# Patient Record
Sex: Female | Born: 1945 | Race: White | Hispanic: No | Marital: Married | Smoking: Never smoker
Health system: Southern US, Community
[De-identification: ages and names within clinical notes are randomized; demographics above are authoritative.]

## PROBLEM LIST (undated history)

## (undated) DIAGNOSIS — G4730 Sleep apnea, unspecified: Secondary | ICD-10-CM

## (undated) DIAGNOSIS — R079 Chest pain, unspecified: Secondary | ICD-10-CM

## (undated) DIAGNOSIS — E785 Hyperlipidemia, unspecified: Secondary | ICD-10-CM

## (undated) DIAGNOSIS — E119 Type 2 diabetes mellitus without complications: Secondary | ICD-10-CM

## (undated) DIAGNOSIS — C9510 Chronic leukemia of unspecified cell type not having achieved remission: Secondary | ICD-10-CM

## (undated) DIAGNOSIS — I10 Essential (primary) hypertension: Secondary | ICD-10-CM

## (undated) DIAGNOSIS — C801 Malignant (primary) neoplasm, unspecified: Secondary | ICD-10-CM

## (undated) HISTORY — DX: Essential (primary) hypertension: I10

## (undated) HISTORY — DX: Chronic leukemia of unspecified cell type not having achieved remission: C95.10

## (undated) HISTORY — DX: Malignant (primary) neoplasm, unspecified: C80.1

## (undated) HISTORY — DX: Sleep apnea, unspecified: G47.30

## (undated) HISTORY — DX: Hyperlipidemia, unspecified: E78.5

## (undated) HISTORY — DX: Type 2 diabetes mellitus without complications: E11.9

## (undated) HISTORY — PX: ABDOMINAL HYSTERECTOMY: SHX81

## (undated) HISTORY — DX: Chest pain, unspecified: R07.9

---
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
## 2004-05-13 HISTORY — PX: ANKLE SURGERY: SHX546

## 2008-08-11 DIAGNOSIS — R079 Chest pain, unspecified: Secondary | ICD-10-CM

## 2008-08-11 HISTORY — DX: Chest pain, unspecified: R07.9

## 2008-08-29 ENCOUNTER — Ambulatory Visit (HOSPITAL_COMMUNITY): Admission: RE | Admit: 2008-08-29 | Discharge: 2008-08-29 | Payer: Self-pay | Admitting: Cardiology

## 2008-09-01 ENCOUNTER — Ambulatory Visit (HOSPITAL_COMMUNITY): Admission: RE | Admit: 2008-09-01 | Discharge: 2008-09-01 | Payer: Self-pay | Admitting: Cardiology

## 2010-08-22 LAB — GLUCOSE, CAPILLARY
Glucose-Capillary: 173 mg/dL — ABNORMAL HIGH (ref 70–99)
Glucose-Capillary: 174 mg/dL — ABNORMAL HIGH (ref 70–99)

## 2012-08-07 ENCOUNTER — Encounter: Payer: Self-pay | Admitting: *Deleted

## 2014-03-14 ENCOUNTER — Encounter: Payer: BC Managed Care – PPO | Attending: "Endocrinology | Admitting: Nutrition

## 2014-03-14 DIAGNOSIS — Z713 Dietary counseling and surveillance: Secondary | ICD-10-CM | POA: Insufficient documentation

## 2014-03-14 DIAGNOSIS — E119 Type 2 diabetes mellitus without complications: Secondary | ICD-10-CM | POA: Insufficient documentation

## 2014-03-16 ENCOUNTER — Encounter: Payer: Self-pay | Admitting: Nutrition

## 2014-03-16 NOTE — Progress Notes (Signed)
  Medical Nutrition Therapy:  Appt start time: 1100 end time:  1130.   Assessment:  Primary concerns today: Diabetes. She is here today to see Dr. Dorris Fetch. A1C was >11% and now down to 7.9%. Drives a school bus. 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 Has basal cell carcinoma on face that was removed.  Is on Metformin 2g/d. Will stop Glipizide today due to some history of low blood sugars per Dr. Dorris Fetch. Is on Invokana and going to reduce to 100 mg/d due to frequent UTI's.  Is fairly active with walking. Eats three fairly balanced meals.   Preferred Learning Style:     No preference indicated   Learning Readiness:     Ready  Change in progress   MEDICATIONS: See lis   DIETARY INTAKE:  Eats three meals per day. Has improved eating habits in the last three-6 months due to higher A1C.    Usual physical activity: Walks  Estimated energy needs: 1500 calories 170 g carbohydrates 112 g protein  42 g fat  Progress Towards Goal(s):  In progress.   Nutritional Diagnosis:  NB-1.1 Food and nutrition-related knowledge deficit As related to Diabetes.  As evidenced by A1C 7.9%..    Intervention:  Nutrition counseling and diabetes education. Reviewed CHO Counting and meal planning and using SBG to evaluate BS control.  Plan:  Aim for 2-3 Carb Choices per meal (30-45 grams) +/- 1 either way  Avoid snacks Include protein in moderation with your meals Consider reading food labels for Total Carbohydrate Consider  increasing your activity level by 30 for 60 minutes daily as tolerated Continue checking BG at alternate times per day as directed by MD  Continue taking medication as directed by MD Keep a food journal and BS log and bring at next visit.   Teaching Method Utilized:  Visual Auditory Hands on  Handouts given during visit include: The Plate Method Carb Counting  Meal Plan Card   Barriers to learning/adherence to lifestyle change:  none  Demonstrated degree of understanding via:  Teach Back   Monitoring/Evaluation:  Dietary intake, exercise, meal planning, and body weight in 1 month(s).  Teaching Method Utilized:  Visual Auditory Hands on  Handouts given during visit include: The Plate Method Carb Counting  Meal Plan Card   Barriers to learning/adherence to lifestyle change: none unless she undergoes treatment for CLL Leukemia.  Demonstrated degree of understanding via:  Teach Back   Monitoring/Evaluation:  Dietary intake, exercise, meal planning, SBG, and body weight in 1 month(s).

## 2014-04-15 ENCOUNTER — Encounter: Payer: BC Managed Care – PPO | Attending: Orthopedic Surgery | Admitting: Nutrition

## 2014-04-15 ENCOUNTER — Encounter: Payer: Self-pay | Admitting: Nutrition

## 2014-04-15 VITALS — Ht 63.0 in | Wt 147.0 lb

## 2014-04-15 DIAGNOSIS — E118 Type 2 diabetes mellitus with unspecified complications: Secondary | ICD-10-CM | POA: Insufficient documentation

## 2014-04-15 DIAGNOSIS — Z713 Dietary counseling and surveillance: Secondary | ICD-10-CM | POA: Diagnosis not present

## 2014-04-15 DIAGNOSIS — E1165 Type 2 diabetes mellitus with hyperglycemia: Secondary | ICD-10-CM

## 2014-04-15 DIAGNOSIS — IMO0002 Reserved for concepts with insufficient information to code with codable children: Secondary | ICD-10-CM

## 2014-04-15 NOTE — Patient Instructions (Signed)
Plan:  Aim for 3 Carb Choices per meal (45 grams) +/- 1 either way  Avoid snacks Include protein in moderation with your meals  Consider reading food labels for Total Carbohydrate Continue increasing your activity level by 30 for 60 minutes daily as tolerated Continue checking BG at alternate times per day as directed by MD  Continue taking medication as directed by MD Keep a food journal and BS log and bring at next visit. May have 15 g of CHO and protein for a snack if needed for low blood sugars or overly hungry. Goal: Maintain weight. 2. Keep A1C close to 7%. 3. Aim for 45 of carbs per meal 4. Add 2 svg of fat per meal for needed calories for weight gain/maintence.

## 2014-04-15 NOTE — Progress Notes (Signed)
  Medical Nutrition Therapy:  Appt start time: 1100 end time:  1130.   Assessment:  Primary concerns today: Diabetes. Recently put on the Glimiperide 2 mg per day.Has been changing her diet a lot. BS are now are 160 mg/dl. She notes she is hungry at times. Has lost 6 lbs since last visit. Doesn't want to lose anymore weight.  Diet reveals she is eating 45 g of carbs plus protein with meals. BS are improving now that she is back on Glimepiride 2g. She is still walking and staying very active..  Preferred Learning Style:     No preference indicated   Learning Readiness:     Ready  Change in progress   MEDICATIONS: See lis   DIETARY INTAKE:  Eats three meals per day. Has improved eating habits in the last three-6 months due to higher A1C. BS are better now that she is back on low dose of Glimepiride. Meals are balanced with about 45 g of carbs per meal plus protein. Needs slightly more lower carb vegetables at times.  Usual physical activity: Walks  Estimated energy needs: 1500 calories 170 g carbohydrates 112 g protein  42 g fat  Progress Towards Goal(s):  In progress.   Nutritional Diagnosis:  NB-1.1 Food and nutrition-related knowledge deficit As related to Diabetes.  As evidenced by A1C 7.9%..    Intervention:  Nutrition counseling and diabetes education. Reviewed CHO Counting and meal planning and using SBG to evaluate BS control.  Plan:  Aim for 3 Carb Choices per meal (45 grams) +/- 1 either way  Avoid snacks Include protein in moderation with your meals  Consider reading food labels for Total Carbohydrate Continue increasing your activity level by 30 for 60 minutes daily as tolerated Continue checking BG at alternate times per day as directed by MD  Continue taking medication as directed by MD Keep a food journal and BS log and bring at next visit. May have 15 g of CHO and protein for a snack if needed for low blood sugars or overly hungry. Goal: Maintain  weight. 2. Keep A1C close to 7%. 3. Aim for 45 of carbs per meal 4. Add 2 svg of fat per meal for needed calories for weight gain/maintence.  Diet Recall  B) 3/4 c cherrios, 1 cup 2T milk and some nuts             L)  Sandwich with meat,  And piece of fruit,  And some veggies  D) Fish, 2 hush puppies, sweet potato,nuts and water or coffee  Teaching Method Utilized:  Visual Auditory Hands on  Handouts given during visit include: The Plate Method Meal Plan Card   Barriers to learning/adherence to lifestyle change: none  Demonstrated degree of understanding via:  Teach Back   Monitoring/Evaluation:  Dietary intake, exercise, meal planning, and body weight in 1 month(s).  Teaching Method Utilized:  Visual Auditory Hands on  Handouts given during visit include: The Plate Method Carb Counting  Meal Plan Card   Barriers to learning/adherence to lifestyle change: none unless she undergoes treatment for CLL Leukemia.  Demonstrated degree of understanding via:  Teach Back   Monitoring/Evaluation:  Dietary intake, exercise, meal planning, SBG, and body weight in 3  month(s).

## 2014-07-15 ENCOUNTER — Ambulatory Visit: Payer: Medicare Other | Admitting: Nutrition

## 2015-03-15 ENCOUNTER — Encounter: Payer: Medicare Other | Admitting: Nutrition

## 2015-03-29 LAB — HEMOGLOBIN A1C: Hgb A1c MFr Bld: 8.3 % — AB (ref 4.0–6.0)

## 2015-04-04 ENCOUNTER — Ambulatory Visit (INDEPENDENT_AMBULATORY_CARE_PROVIDER_SITE_OTHER): Payer: BLUE CROSS/BLUE SHIELD | Admitting: "Endocrinology

## 2015-04-04 ENCOUNTER — Encounter: Payer: Self-pay | Admitting: "Endocrinology

## 2015-04-04 VITALS — BP 146/83 | HR 75 | Ht 64.0 in | Wt 143.0 lb

## 2015-04-04 DIAGNOSIS — I10 Essential (primary) hypertension: Secondary | ICD-10-CM | POA: Diagnosis not present

## 2015-04-04 DIAGNOSIS — Z794 Long term (current) use of insulin: Secondary | ICD-10-CM | POA: Diagnosis not present

## 2015-04-04 DIAGNOSIS — E785 Hyperlipidemia, unspecified: Secondary | ICD-10-CM | POA: Diagnosis not present

## 2015-04-04 DIAGNOSIS — E1165 Type 2 diabetes mellitus with hyperglycemia: Secondary | ICD-10-CM | POA: Diagnosis not present

## 2015-04-04 DIAGNOSIS — IMO0002 Reserved for concepts with insufficient information to code with codable children: Secondary | ICD-10-CM | POA: Insufficient documentation

## 2015-04-04 DIAGNOSIS — E118 Type 2 diabetes mellitus with unspecified complications: Secondary | ICD-10-CM | POA: Diagnosis not present

## 2015-04-04 DIAGNOSIS — E782 Mixed hyperlipidemia: Secondary | ICD-10-CM | POA: Insufficient documentation

## 2015-04-04 MED ORDER — INSULIN GLARGINE 300 UNIT/ML ~~LOC~~ SOPN: 24.0000 [IU] | PEN_INJECTOR | Freq: Every day | SUBCUTANEOUS | Status: DC

## 2015-04-04 NOTE — Patient Instructions (Signed)

## 2015-04-04 NOTE — Progress Notes (Signed)
Subjective:    Patient ID: Gwenyth Allegra, female    DOB: 26-Jul-1945, PCP No primary care provider on file.   Past Medical History  Diagnosis Date  . Diabetes ([place])   . Chest pain 08/2008    normal coronary angiogram  . Hypertension   . Hyperlipemia   . Sleep apnea   . Cancer ([place])     Basal cell on face  . Leukemia, chronic ([place])     CLL   Past Surgical History  Procedure Laterality Date  . Tonsillectomy  1957  . Ankle surgery  2006  . Abdominal hysterectomy     Social History   Social History  . Marital Status: Married    Spouse Name: N/A  . Number of Children: N/A  . Years of Education: N/A   Social History Main Topics  . Smoking status: Never Smoker   . Smokeless tobacco: None  . Alcohol Use: None  . Drug Use: None  . Sexual Activity: Not Asked   Other Topics Concern  . None   Social History Narrative   Outpatient Encounter Prescriptions as of 04/04/2015  Medication Sig  . aspirin 81 MG tablet Take 81 mg by mouth daily.  . canagliflozin (INVOKANA) 100 MG TABS tablet Take 100 mg by mouth.  . cholecalciferol (VITAMIN D) 1000 UNITS tablet Take 1,000 Units by mouth daily.  . Fish Oil-Cholecalciferol (FISH OIL + D3) 1000-1000 MG-UNIT CAPS Take by mouth.  . gabapentin (NEURONTIN) 300 MG capsule Take 300 mg by mouth daily.  . Insulin Glargine (TOUJEO SOLOSTAR) 300 UNIT/ML SOPN Inject 24 Units into the skin at bedtime.  . metFORMIN (GLUCOPHAGE) 1000 MG tablet Take 1,000 mg by mouth 2 (two) times daily with a meal.  . Oral Electrolytes (SUSTAIN PO) Take by mouth.  . rosuvastatin (CRESTOR) 10 MG tablet Take 10 mg by mouth daily.  . verapamil (COVERA HS) 240 MG (CO) 24 hr tablet Take 240 mg by mouth at bedtime.  . [DISCONTINUED] Insulin Glargine (TOUJEO SOLOSTAR) 300 UNIT/ML SOPN Inject 24 Units into the skin at bedtime.  . fluconazole (DIFLUCAN) 150 MG tablet Take 150 mg by mouth daily as needed.   . furosemide (LASIX) 20 MG tablet Take 20 mg by mouth daily.   Marland Kitchen olmesartan (BENICAR) 40 MG tablet Take 40 mg by mouth daily.  . vitamin B-12 (CYANOCOBALAMIN) 1000 MCG tablet Take 1,000 mcg by mouth daily.  . [DISCONTINUED] glipiZIDE (GLUCOTROL) 10 MG tablet Take 10 mg by mouth 2 (two) times daily before a meal.   No facility-administered encounter medications on file as of 04/04/2015.   ALLERGIES: No Known Allergies VACCINATION STATUS:  There is no immunization history on file for this patient.  Diabetes She presents for her follow-up diabetic visit. She has type 2 diabetes mellitus. Onset time: He was diagnosed at approximate age of 65 years. Her disease course has been improving. There are no hypoglycemic associated symptoms. Pertinent negatives for hypoglycemia include no confusion, headaches, pallor or seizures. There are no diabetic associated symptoms. Pertinent negatives for diabetes include no chest pain, no polydipsia, no polyphagia and no polyuria. There are no hypoglycemic complications. Symptoms are improving. There are no diabetic complications. Risk factors for coronary artery disease include diabetes mellitus, dyslipidemia, hypertension and tobacco exposure. Current diabetic treatment includes insulin injections and oral agent (dual therapy). Her weight is stable. She is following a generally unhealthy diet. She has had a previous visit with a dietitian. She rarely participates in exercise. Her overall  blood glucose range is 180-200 mg/dl. An ACE inhibitor/angiotensin II receptor blocker is being taken.  Hyperlipidemia This is a chronic problem. The current episode started more than 1 year ago. Pertinent negatives include no chest pain, myalgias or shortness of breath. Current antihyperlipidemic treatment includes statins.  Hypertension This is a chronic problem. The current episode started more than 1 year ago. Pertinent negatives include no chest pain, headaches, palpitations or shortness of breath. Risk factors for coronary artery disease  include diabetes mellitus and dyslipidemia.     Review of Systems  Constitutional: Negative for unexpected weight change.  HENT: Negative for trouble swallowing and voice change.   Eyes: Negative for visual disturbance.  Respiratory: Negative for cough, shortness of breath and wheezing.   Cardiovascular: Negative for chest pain, palpitations and leg swelling.  Gastrointestinal: Negative for nausea, vomiting and diarrhea.  Endocrine: Negative for cold intolerance, heat intolerance, polydipsia, polyphagia and polyuria.  Musculoskeletal: Negative for myalgias and arthralgias.  Skin: Negative for color change, pallor, rash and wound.  Neurological: Negative for seizures and headaches.  Psychiatric/Behavioral: Negative for suicidal ideas and confusion.    Objective:    BP 146/83 mmHg  Pulse 75  Ht 5\' 4"  (1.626 m)  Wt 143 lb (64.864 kg)  BMI 24.53 kg/m2  SpO2 98%  Wt Readings from Last 3 Encounters:  04/04/15 143 lb (64.864 kg)  04/15/14 147 lb (66.679 kg)  03/16/14 153 lb (69.4 kg)    Physical Exam  Constitutional: She is oriented to person, place, and time. She appears well-developed.  HENT:  Head: Normocephalic and atraumatic.  Eyes: EOM are normal.  Neck: Normal range of motion. Neck supple. No tracheal deviation present. No thyromegaly present.  Cardiovascular: Normal rate and regular rhythm.   Pulmonary/Chest: Effort normal and breath sounds normal.  Abdominal: Soft. Bowel sounds are normal. There is no tenderness. There is no guarding.  Musculoskeletal: Normal range of motion. She exhibits no edema.  Neurological: She is alert and oriented to person, place, and time. She has normal reflexes. No cranial nerve deficit. Coordination normal.  Skin: Skin is warm and dry. No rash noted. No erythema. No pallor.  Psychiatric: She has a normal mood and affect. Judgment normal.    Results for orders placed or performed in visit on 04/04/15  Hemoglobin A1c  Result Value Ref  Range   Hgb A1c MFr Bld 8.3 (A) 4.0 - 6.0 %      Assessment & Plan:   1. Uncontrolled type 2 diabetes mellitus with complication, with long-term current use of insulin ([place])   - patient remains at a high risk for more acute and chronic complications of diabetes which include CAD, CVA, CKD, retinopathy, and neuropathy. These are all discussed in detail with the patient.  Patient came with glucose slightly improved profile, and  recent A1c of 8.3 % generally improving from 11.2%.  Glucose logs and insulin administration records pertaining to this visit,  to be scanned into patient's records.  Recent labs reviewed.   - I have re-counseled the patient on diet management  by adopting a carbohydrate restricted / protein rich  Diet.  - Suggestion is made for patient to avoid simple carbohydrates   from their diet including Cakes , Desserts, Ice Cream,  Soda (  diet and regular) , Sweet Tea , Candies,  Chips, Cookies, Artificial Sweeteners,   and "Sugar-free" Products .  This will help patient to have stable blood glucose profile and potentially avoid unintended  Weight gain.  -  Patient is advised to stick to a routine mealtimes to eat 3 meals  a day and avoid unnecessary snacks ( to snack only to correct hypoglycemia).  - The patient  has been  scheduled with Jearld Fenton, RDN, CDE for individualized DM education.  - I have approached patient with the following individualized plan to manage diabetes and patient agrees.  - I will proceed with basal insulin Toujeo , agrees to 24 units QHS, associated with strict monitoring of glucose  twice a day. -If she doesn't improve her glucose profile and A1c, she may need prandial insulin at this visit.  - Patient is warned not to take insulin without proper monitoring per orders. -Adjustment parameters are given for hypo and hyperglycemia in writing. -Patient is encouraged to call clinic for blood glucose levels less than 70 or above 300 mg /dl. - I  will continue forming 1000 mg by mouth twice a day and Invokana 100 mg by mouth every morning, therapeutically suitable for patient.   - She is not a good candidate forSGLT2 inhibitors and incretin therapy . - Patient specific target  for A1c; LDL, HDL, Triglycerides, and  Waist Circumference were discussed in detail.  2) BP/HTN: Controlled. Continue current medications including ACEI/ARB. 3) Lipids/HPL:  continue statins. 4)  Weight/Diet: CDE consult in progress, exercise, and carbohydrates information provided.  5) Chronic Care/Health Maintenance:  -Patient is on ACEI/ARB and Statin medications and encouraged to continue to follow up with Ophthalmology, Podiatrist at least yearly or according to recommendations, and advised to  stay away from smoking. I have recommended yearly flu vaccine and pneumonia vaccination at least every 5 years; moderate intensity exercise for up to 150 minutes weekly; and  sleep for at least 7 hours a day.  - 25 minutes of time was spent on the care of this patient , 50% of which was applied for counseling on diabetes complications and their preventions.  - I advised patient to maintain close follow up with their PCP for primary care needs.  Patient is asked to bring meter and  blood glucose logs during their next visit.   Follow up plan: -Return in about 3 months (around 07/05/2015) for diabetes, high blood pressure, high cholesterol, follow up with pre-visit labs, meter, and logs.  Glade Lloyd, MD Phone: [phone]  Fax: [phone]   04/04/2015, 8:32 PM

## 2015-07-03 LAB — HEMOGLOBIN A1C: Hemoglobin A1C: 7.1

## 2015-07-06 ENCOUNTER — Ambulatory Visit (INDEPENDENT_AMBULATORY_CARE_PROVIDER_SITE_OTHER): Payer: BLUE CROSS/BLUE SHIELD | Admitting: "Endocrinology

## 2015-07-06 ENCOUNTER — Encounter: Payer: Self-pay | Admitting: "Endocrinology

## 2015-07-06 VITALS — BP 132/71 | HR 73 | Ht 64.0 in | Wt 145.0 lb

## 2015-07-06 DIAGNOSIS — Z794 Long term (current) use of insulin: Secondary | ICD-10-CM | POA: Diagnosis not present

## 2015-07-06 DIAGNOSIS — E1165 Type 2 diabetes mellitus with hyperglycemia: Secondary | ICD-10-CM

## 2015-07-06 DIAGNOSIS — I10 Essential (primary) hypertension: Secondary | ICD-10-CM | POA: Diagnosis not present

## 2015-07-06 DIAGNOSIS — E118 Type 2 diabetes mellitus with unspecified complications: Secondary | ICD-10-CM | POA: Diagnosis not present

## 2015-07-06 DIAGNOSIS — E785 Hyperlipidemia, unspecified: Secondary | ICD-10-CM

## 2015-07-06 DIAGNOSIS — IMO0002 Reserved for concepts with insufficient information to code with codable children: Secondary | ICD-10-CM

## 2015-07-06 NOTE — Progress Notes (Signed)
Subjective:    Patient ID: Taylor Wilkins, female    DOB: 1946-01-05, PCP No primary care provider on file.   Past Medical History  Diagnosis Date  . Diabetes ([place])   . Chest pain 08/2008    normal coronary angiogram  . Hypertension   . Hyperlipemia   . Sleep apnea   . Cancer ([place])     Basal cell on face  . 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 Tonsillectomy  1957  . Ankle surgery  2006  . Abdominal hysterectomy     Social History   Social History  . Marital Status: Married    Spouse Name: N/A  . Number of Children: N/A  . Years of Education: N/A   Social History Main Topics  . Smoking status: Never Smoker   . Smokeless tobacco: None  . Alcohol Use: None  . Drug Use: None  . Sexual Activity: Not Asked   Other Topics Concern  . None   Social History Narrative   Outpatient Encounter Prescriptions as of 07/06/2015  Medication Sig  . aspirin 81 MG tablet Take 81 mg by mouth daily.  . canagliflozin (INVOKANA) 100 MG TABS tablet Take 100 mg by mouth.  . cholecalciferol (VITAMIN D) 1000 UNITS tablet Take 1,000 Units by mouth daily.  . Fish Oil-Cholecalciferol (FISH OIL + D3) 1000-1000 MG-UNIT CAPS Take by mouth.  . fluconazole (DIFLUCAN) 150 MG tablet Take 150 mg by mouth daily as needed.   . furosemide (LASIX) 20 MG tablet Take 20 mg by mouth daily.  Marland Kitchen gabapentin (NEURONTIN) 300 MG capsule Take 300 mg by mouth daily.  . Insulin Glargine (TOUJEO SOLOSTAR) 300 UNIT/ML SOPN Inject 24 Units into the skin at bedtime.  . metFORMIN (GLUCOPHAGE) 1000 MG tablet Take 1,000 mg by mouth 2 (two) times daily with a meal.  . olmesartan (BENICAR) 40 MG tablet Take 40 mg by mouth daily.  . Oral Electrolytes (SUSTAIN PO) Take by mouth.  . rosuvastatin (CRESTOR) 10 MG tablet Take 10 mg by mouth daily.  . verapamil (COVERA HS) 240 MG (CO) 24 hr tablet Take 240 mg by mouth at bedtime.  . vitamin B-12 (CYANOCOBALAMIN) 1000 MCG tablet  Take 1,000 mcg by mouth daily.   No facility-administered encounter medications on file as of 07/06/2015.   ALLERGIES: No Known Allergies VACCINATION STATUS:  There is no immunization history on file for this patient.  Diabetes She presents for her follow-up diabetic visit. She has type 2 diabetes mellitus. Onset time: He was diagnosed at approximate age of 31 years. Her disease course has been improving. There are no hypoglycemic associated symptoms. Pertinent negatives for hypoglycemia include no confusion, headaches, pallor or seizures. There are no diabetic associated symptoms. Pertinent negatives for diabetes include no chest pain, no polydipsia, no polyphagia and no polyuria. There are no hypoglycemic complications. Symptoms are improving. There are no diabetic complications. Risk factors for coronary artery disease include diabetes mellitus, dyslipidemia, hypertension and tobacco exposure. Current diabetic treatment includes insulin injections and oral agent (dual therapy). Her weight is stable. She is following a generally unhealthy diet. She has had a previous visit with a dietitian. She rarely participates in exercise. Her breakfast blood glucose range is generally 140-180 mg/dl. Her overall blood glucose range is 140-180 mg/dl. An ACE inhibitor/angiotensin II receptor blocker is being taken.  Hyperlipidemia This is a chronic problem. The current episode started more than 1  year ago. Pertinent negatives include no chest pain, myalgias or shortness of breath. Current antihyperlipidemic treatment includes statins.  Hypertension This is a chronic problem. The current episode started more than 1 year ago. Pertinent negatives include no chest pain, headaches, palpitations or shortness of breath. Risk factors for coronary artery disease include diabetes mellitus and dyslipidemia.     Review of Systems  Constitutional: Negative for unexpected weight change.  HENT: Negative for trouble  swallowing and voice change.   Eyes: Negative for visual disturbance.  Respiratory: Negative for cough, shortness of breath and wheezing.   Cardiovascular: Negative for chest pain, palpitations and leg swelling.  Gastrointestinal: Negative for nausea, vomiting and diarrhea.  Endocrine: Negative for cold intolerance, heat intolerance, polydipsia, polyphagia and polyuria.  Musculoskeletal: Negative for myalgias and arthralgias.  Skin: Negative for color change, pallor, rash and wound.  Neurological: Negative for seizures and headaches.  Psychiatric/Behavioral: Negative for suicidal ideas and confusion.    Objective:    BP 132/71 mmHg  Pulse 73  Ht 5\' 4"  (1.626 m)  Wt 145 lb (65.772 kg)  BMI 24.88 kg/m2  SpO2 98%  Wt Readings from Last 3 Encounters:  07/06/15 145 lb (65.772 kg)  04/04/15 143 lb (64.864 kg)  04/15/14 147 lb (66.679 kg)    Physical Exam  Constitutional: She is oriented to person, place, and time. She appears well-developed.  HENT:  Head: Normocephalic and atraumatic.  Eyes: EOM are normal.  Neck: Normal range of motion. Neck supple. No tracheal deviation present. No thyromegaly present.  Cardiovascular: Normal rate and regular rhythm.   Pulmonary/Chest: Effort normal and breath sounds normal.  Abdominal: Soft. Bowel sounds are normal. There is no tenderness. There is no guarding.  Musculoskeletal: Normal range of motion. She exhibits no edema.  Neurological: She is alert and oriented to person, place, and time. She has normal reflexes. No cranial nerve deficit. Coordination normal.  Skin: Skin is warm and dry. No rash noted. No erythema. No pallor.  Psychiatric: She has a normal mood and affect. Judgment normal.    Results for orders placed or performed in visit on 07/06/15  Hemoglobin A1c  Result Value Ref Range   Hemoglobin A1C 7.1       Assessment & Plan:   1. Uncontrolled type 2 diabetes mellitus with complication, with long-term current use of  insulin ([place])   - patient remains at a high risk for more acute and chronic complications of diabetes which include CAD, CVA, CKD, retinopathy, and neuropathy. These are all discussed in detail with the patient.  Patient came with glucose slightly improved profile, and  recent A1c of 7.1 % generally improving from 11.2%.  Glucose logs and insulin administration records pertaining to this visit,  to be scanned into patient's records.  Recent labs reviewed.   - I have re-counseled the patient on diet management  by adopting a carbohydrate restricted / protein rich  Diet.  - Suggestion is made for patient to avoid simple carbohydrates   from their diet including Cakes , Desserts, Ice Cream,  Soda (  diet and regular) , Sweet Tea , Candies,  Chips, Cookies, Artificial Sweeteners,   and "Sugar-free" Products .  This will help patient to have stable blood glucose profile and potentially avoid unintended  Weight gain.  - Patient is advised to stick to a routine mealtimes to eat 3 meals  a day and avoid unnecessary snacks ( to snack only to correct hypoglycemia).  - The patient  has been  scheduled with Jearld Fenton, RDN, CDE for individualized DM education.  - I have approached patient with the following individualized plan to manage diabetes and patient agrees.  - I will proceed with basal insulin Toujeo , decrease to 20 units QHS, associated with strict monitoring of glucose  twice a day. -Based on her presentation she would not need prandial insulin for now.  -Patient is encouraged to call clinic for blood glucose levels less than 70 or above 300 mg /dl. - I will continue forming 1000 mg by mouth twice a day and Invokana 100 mg by mouth every morning, therapeutically suitable for patient.   - She is not a good candidate for incretin therapy . - Patient specific target  for A1c; LDL, HDL, Triglycerides, and  Waist Circumference were discussed in detail.  2) BP/HTN: Controlled. Continue  current medications including ACEI/ARB. 3) Lipids/HPL:  continue statins. 4)  Weight/Diet: CDE consult in progress, exercise, and carbohydrates information provided.  5) Chronic Care/Health Maintenance:  -Patient is on ACEI/ARB and Statin medications and encouraged to continue to follow up with Ophthalmology, Podiatrist at least yearly or according to recommendations, and advised to  stay away from smoking. I have recommended yearly flu vaccine and pneumonia vaccination at least every 5 years; moderate intensity exercise for up to 150 minutes weekly; and  sleep for at least 7 hours a day.  - 25 minutes of time was spent on the care of this patient , 50% of which was applied for counseling on diabetes complications and their preventions.  - I advised patient to maintain close follow up with their PCP for primary care needs.  Patient is asked to bring meter and  blood glucose logs during their next visit.   Follow up plan: -Return in about 3 months (around 10/03/2015) for diabetes, high blood pressure, high cholesterol, follow up with pre-visit labs, meter, and logs.  Glade Lloyd, MD Phone: [phone]  Fax: [phone]   07/06/2015, 11:19 AM

## 2015-07-06 NOTE — Patient Instructions (Signed)

## 2015-10-11 LAB — HEMOGLOBIN A1C: HEMOGLOBIN A1C: 7.8

## 2015-10-16 ENCOUNTER — Ambulatory Visit: Payer: Medicare Other | Admitting: "Endocrinology

## 2015-10-23 ENCOUNTER — Encounter: Payer: Self-pay | Admitting: "Endocrinology

## 2015-10-23 ENCOUNTER — Ambulatory Visit (INDEPENDENT_AMBULATORY_CARE_PROVIDER_SITE_OTHER): Payer: BLUE CROSS/BLUE SHIELD | Admitting: "Endocrinology

## 2015-10-23 VITALS — BP 136/77 | HR 84 | Ht 64.0 in | Wt 149.0 lb

## 2015-10-23 DIAGNOSIS — E785 Hyperlipidemia, unspecified: Secondary | ICD-10-CM | POA: Diagnosis not present

## 2015-10-23 DIAGNOSIS — I10 Essential (primary) hypertension: Secondary | ICD-10-CM

## 2015-10-23 DIAGNOSIS — Z794 Long term (current) use of insulin: Secondary | ICD-10-CM | POA: Diagnosis not present

## 2015-10-23 DIAGNOSIS — E118 Type 2 diabetes mellitus with unspecified complications: Secondary | ICD-10-CM

## 2015-10-23 DIAGNOSIS — E1165 Type 2 diabetes mellitus with hyperglycemia: Secondary | ICD-10-CM

## 2015-10-23 DIAGNOSIS — IMO0002 Reserved for concepts with insufficient information to code with codable children: Secondary | ICD-10-CM

## 2015-10-23 MED ORDER — EMPAGLIFLOZIN 10 MG PO TABS: 10.0000 mg | ORAL_TABLET | Freq: Every day | ORAL | Status: DC

## 2015-10-23 NOTE — Progress Notes (Signed)
Subjective:    Patient ID: Taylor Wilkins, female    DOB: 05/11/1946, PCP [facility]   Past Medical History  Diagnosis Date  . Diabetes ([place])   . Chest pain 08/2008    normal coronary angiogram  . Hypertension   . Hyperlipemia   . Sleep apnea   . Cancer ([place])     Basal cell on face  . 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 Tonsillectomy  1957  . Ankle surgery  2006  . Abdominal hysterectomy     Social History   Social History  . Marital Status: Married    Spouse Name: N/A  . Number of Children: N/A  . Years of Education: N/A   Social History Main Topics  . Smoking status: Never Smoker   . Smokeless tobacco: None  . Alcohol Use: None  . Drug Use: None  . Sexual Activity: Not Asked   Other Topics Concern  . None   Social History Narrative   Outpatient Encounter Prescriptions as of 10/23/2015  Medication Sig  . Calcium Citrate-Vitamin D (CALCIUM + D PO) Take by mouth.  . Cranberry 400 MG TABS Take 4,200 mg by mouth daily.  . Insulin Glargine (TOUJEO SOLOSTAR ) Inject 20 Units into the skin at bedtime.  Marland Kitchen aspirin 81 MG tablet Take 81 mg by mouth daily.  . cholecalciferol (VITAMIN D) 1000 UNITS tablet Take 1,000 Units by mouth daily.  . empagliflozin (JARDIANCE) 10 MG TABS tablet Take 10 mg by mouth daily.  . Fish Oil-Cholecalciferol (FISH OIL + D3) 1000-1000 MG-UNIT CAPS Take by mouth.  . fluconazole (DIFLUCAN) 150 MG tablet Take 150 mg by mouth as directed.   . gabapentin (NEURONTIN) 300 MG capsule Take 300 mg by mouth daily.  . metFORMIN (GLUCOPHAGE) 1000 MG tablet Take 1,000 mg by mouth 2 (two) times daily with a meal.  . rosuvastatin (CRESTOR) 10 MG tablet Take 10 mg by mouth daily.  . verapamil (COVERA HS) 240 MG (CO) 24 hr tablet Take 240 mg by mouth at bedtime.  . vitamin B-12 (CYANOCOBALAMIN) 1000 MCG tablet Take 1,000 mcg by mouth daily.  . [DISCONTINUED] canagliflozin (INVOKANA) 100 MG TABS  tablet Take 100 mg by mouth.  . [DISCONTINUED] furosemide (LASIX) 20 MG tablet Take 20 mg by mouth daily.  . [DISCONTINUED] Insulin Glargine (TOUJEO SOLOSTAR) 300 UNIT/ML SOPN Inject 24 Units into the skin at bedtime. (Patient taking differently: Inject 20 Units into the skin at bedtime. )  . [DISCONTINUED] olmesartan (BENICAR) 40 MG tablet Take 40 mg by mouth daily.  . [DISCONTINUED] Oral Electrolytes (SUSTAIN PO) Take by mouth.   No facility-administered encounter medications on file as of 10/23/2015.   ALLERGIES: No Known Allergies VACCINATION STATUS:  There is no immunization history on file for this patient.  Diabetes She presents for her follow-up diabetic visit. She has type 2 diabetes mellitus. Onset time: He was diagnosed at approximate age of 7 years. Her disease course has been stable. There are no hypoglycemic associated symptoms. Pertinent negatives for hypoglycemia include no confusion, headaches, pallor or seizures. There are no diabetic associated symptoms. Pertinent negatives for diabetes include no chest pain, no polydipsia, no polyphagia and no polyuria. There are no hypoglycemic complications. Symptoms are stable. There are no diabetic complications. Risk factors for coronary artery disease include diabetes mellitus, dyslipidemia, hypertension and tobacco exposure. Current diabetic treatment includes insulin injections and oral agent (dual therapy).  Her weight is increasing steadily. She is following a generally unhealthy diet. She has had a previous visit with a dietitian. She rarely participates in exercise. Her breakfast blood glucose range is generally 110-130 mg/dl. Her overall blood glucose range is 110-130 mg/dl. An ACE inhibitor/angiotensin II receptor blocker is being taken.  Hyperlipidemia This is a chronic problem. The current episode started more than 1 year ago. Pertinent negatives include no chest pain, myalgias or shortness of breath. Current antihyperlipidemic  treatment includes statins.  Hypertension This is a chronic problem. The current episode started more than 1 year ago. Pertinent negatives include no chest pain, headaches, palpitations or shortness of breath. Risk factors for coronary artery disease include diabetes mellitus and dyslipidemia.     Review of Systems  Constitutional: Negative for unexpected weight change.  HENT: Negative for trouble swallowing and voice change.   Eyes: Negative for visual disturbance.  Respiratory: Negative for cough, shortness of breath and wheezing.   Cardiovascular: Negative for chest pain, palpitations and leg swelling.  Gastrointestinal: Negative for nausea, vomiting and diarrhea.  Endocrine: Negative for cold intolerance, heat intolerance, polydipsia, polyphagia and polyuria.  Musculoskeletal: Negative for myalgias and arthralgias.  Skin: Negative for color change, pallor, rash and wound.  Neurological: Negative for seizures and headaches.  Psychiatric/Behavioral: Negative for suicidal ideas and confusion.    Objective:    BP 136/77 mmHg  Pulse 84  Ht 5\' 4"  (1.626 m)  Wt 149 lb (67.586 kg)  BMI 25.56 kg/m2  SpO2 97%  Wt Readings from Last 3 Encounters:  10/23/15 149 lb (67.586 kg)  07/06/15 145 lb (65.772 kg)  04/04/15 143 lb (64.864 kg)    Physical Exam  Constitutional: She is oriented to person, place, and time. She appears well-developed.  HENT:  Head: Normocephalic and atraumatic.  Eyes: EOM are normal.  Neck: Normal range of motion. Neck supple. No tracheal deviation present. No thyromegaly present.  Cardiovascular: Normal rate and regular rhythm.   Pulmonary/Chest: Effort normal and breath sounds normal.  Abdominal: Soft. Bowel sounds are normal. There is no tenderness. There is no guarding.  Musculoskeletal: Normal range of motion. She exhibits no edema.  Neurological: She is alert and oriented to person, place, and time. She has normal reflexes. No cranial nerve deficit.  Coordination normal.  Skin: Skin is warm and dry. No rash noted. No erythema. No pallor.  Psychiatric: She has a normal mood and affect. Judgment normal.    Results for orders placed or performed in visit on 10/23/15  Hemoglobin A1c  Result Value Ref Range   Hemoglobin A1C 7.8       Assessment & Plan:   1. Uncontrolled type 2 diabetes mellitus with complication, with long-term current use of insulin ([place])   - patient remains at a high risk for more acute and chronic complications of diabetes which include CAD, CVA, CKD, retinopathy, and neuropathy. These are all discussed in detail with the patient.  Patient came with Controlled fasting glucose profile, and  recent A1c Is stable at 7.8 % generally improving from 11.2%.  Glucose logs and insulin administration records pertaining to this visit,  to be scanned into patient's records.  Recent labs reviewed.   - I have re-counseled the patient on diet management  by adopting a carbohydrate restricted / protein rich  Diet.  - Suggestion is made for patient to avoid simple carbohydrates   from their diet including Cakes , Desserts, Ice Cream,  Soda (  diet and regular) , Sweet  Tea , Candies,  Chips, Cookies, Artificial Sweeteners,   and "Sugar-free" Products .  This will help patient to have stable blood glucose profile and potentially avoid unintended  Weight gain.  - Patient is advised to stick to a routine mealtimes to eat 3 meals  a day and avoid unnecessary snacks ( to snack only to correct hypoglycemia).  - The patient  has been  scheduled with Jearld Fenton, RDN, CDE for individualized DM education.  - I have approached patient with the following individualized plan to manage diabetes and patient agrees.  - I will proceed with basal insulin Toujeo  20 units QHS, associated with strict monitoring of glucose  twice a day. -Based on her presentation she would not need prandial insulin for now.  -Patient is encouraged to call clinic  for blood glucose levels less than 70 or above 300 mg /dl. - I will continue forming 1000 mg by mouth twice a day and Invokana 100 mg by mouth every morning, therapeutically suitable for patient.   - She is not a good candidate for incretin therapy . - Patient specific target  for A1c; LDL, HDL, Triglycerides, and  Waist Circumference were discussed in detail.  2) BP/HTN: Controlled. Continue current medications including ACEI/ARB. 3) Lipids/HPL:  continue statins. 4)  Weight/Diet: CDE consult in progress, exercise, and carbohydrates information provided.  5) Chronic Care/Health Maintenance:  -Patient is on ACEI/ARB and Statin medications and encouraged to continue to follow up with Ophthalmology, Podiatrist at least yearly or according to recommendations, and advised to  stay away from smoking. I have recommended yearly flu vaccine and pneumonia vaccination at least every 5 years; moderate intensity exercise for up to 150 minutes weekly; and  sleep for at least 7 hours a day.  - 25 minutes of time was spent on the care of this patient , 50% of which was applied for counseling on diabetes complications and their preventions.  - I advised patient to maintain close follow up with their PCP for primary care needs.  Patient is asked to bring meter and  blood glucose logs during their next visit.   Follow up plan: -Return in about 3 months (around 01/23/2016) for diabetes, high blood pressure, high cholesterol, follow up with pre-visit labs, meter, and logs.  Glade Lloyd, MD Phone: [phone]  Fax: [phone]   10/23/2015, 2:14 PM

## 2015-10-23 NOTE — Patient Instructions (Signed)

## 2015-11-09 ENCOUNTER — Other Ambulatory Visit: Payer: Self-pay | Admitting: "Endocrinology

## 2015-11-28 ENCOUNTER — Telehealth: Payer: Self-pay

## 2015-11-28 NOTE — Telephone Encounter (Signed)
Can cause dry mouth and throat. I advised adequate hydration.

## 2015-11-28 NOTE — Telephone Encounter (Signed)
Pt notified. She states she will contact her PCP also.

## 2015-11-28 NOTE — Telephone Encounter (Signed)
Pt is questioning if Jardiance can cause a sore throat. i advised pt that i did not see this as a side effect of the Jardiance but she wanted to check with Dr Dorris Fetch as well.

## 2015-12-06 ENCOUNTER — Other Ambulatory Visit: Payer: Self-pay | Admitting: "Endocrinology

## 2016-01-23 ENCOUNTER — Ambulatory Visit: Payer: Medicare Other | Admitting: "Endocrinology

## 2016-01-24 LAB — LIPID PANEL
CHOLESTEROL: 128 mg/dL (ref 0–200)
HDL: 43 mg/dL (ref 35–70)
LDL Cholesterol: 67 mg/dL
TRIGLYCERIDES: 88 mg/dL (ref 40–160)

## 2016-01-24 LAB — HEMOGLOBIN A1C: HEMOGLOBIN A1C: 7.9

## 2016-01-29 ENCOUNTER — Encounter: Payer: Self-pay | Admitting: "Endocrinology

## 2016-01-29 ENCOUNTER — Ambulatory Visit (INDEPENDENT_AMBULATORY_CARE_PROVIDER_SITE_OTHER): Payer: BLUE CROSS/BLUE SHIELD | Admitting: "Endocrinology

## 2016-01-29 VITALS — BP 126/78 | HR 83 | Ht 64.0 in | Wt 151.0 lb

## 2016-01-29 DIAGNOSIS — E1165 Type 2 diabetes mellitus with hyperglycemia: Secondary | ICD-10-CM | POA: Diagnosis not present

## 2016-01-29 DIAGNOSIS — Z794 Long term (current) use of insulin: Secondary | ICD-10-CM | POA: Diagnosis not present

## 2016-01-29 DIAGNOSIS — E785 Hyperlipidemia, unspecified: Secondary | ICD-10-CM

## 2016-01-29 DIAGNOSIS — I10 Essential (primary) hypertension: Secondary | ICD-10-CM

## 2016-01-29 DIAGNOSIS — E118 Type 2 diabetes mellitus with unspecified complications: Secondary | ICD-10-CM

## 2016-01-29 DIAGNOSIS — IMO0002 Reserved for concepts with insufficient information to code with codable children: Secondary | ICD-10-CM

## 2016-01-29 MED ORDER — INSULIN GLARGINE 300 UNIT/ML ~~LOC~~ SOPN: 24.0000 [IU] | PEN_INJECTOR | Freq: Every day | SUBCUTANEOUS | 2 refills | Status: DC

## 2016-01-29 NOTE — Progress Notes (Signed)
Subjective:    Patient ID: Taylor Wilkins, female    DOB: 20-Nov-1945, PCP Tillman Abide, FNP   Past Medical History:  Diagnosis Date  . Cancer ([place])    Basal cell on face  . Chest pain 08/2008   normal coronary angiogram  . Diabetes ([place])   . Hyperlipemia   . Hypertension   . 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 Sleep apnea    Past Surgical History:  Procedure Laterality Date  . ABDOMINAL HYSTERECTOMY    . ANKLE SURGERY  2006  . TONSILLECTOMY  1957   Social History   Social History  . Marital status: Married    Spouse name: N/A  . Number of children: N/A  . Years of education: N/A   Social History Main Topics  . Smoking status: Never Smoker  . Smokeless tobacco: Never Used  . Alcohol use None  . Drug use: Unknown  . Sexual activity: Not Asked   Other Topics Concern  . None   Social History Narrative  . None   Outpatient Encounter Prescriptions as of 01/29/2016  Medication Sig  . aspirin 81 MG tablet Take 81 mg by mouth daily.  . B-D UF III MINI PEN NEEDLES 31G X 5 MM MISC USE 1 NEEDLE AS DIRECTED  . Calcium Citrate-Vitamin D (CALCIUM + D PO) Take by mouth.  . cholecalciferol (VITAMIN D) 1000 UNITS tablet Take 1,000 Units by mouth daily.  . Cranberry 400 MG TABS Take 4,200 mg by mouth daily.  . empagliflozin (JARDIANCE) 10 MG TABS tablet Take 10 mg by mouth daily.  . Fish Oil-Cholecalciferol (FISH OIL + D3) 1000-1000 MG-UNIT CAPS Take by mouth.  . fluconazole (DIFLUCAN) 150 MG tablet Take 150 mg by mouth as directed.   . gabapentin (NEURONTIN) 300 MG capsule Take 300 mg by mouth daily.  . Insulin Glargine (TOUJEO SOLOSTAR) 300 UNIT/ML SOPN Inject 24 Units into the skin at bedtime.  . metFORMIN (GLUCOPHAGE) 1000 MG tablet Take 1,000 mg by mouth 2 (two) times daily with a meal.  . rosuvastatin (CRESTOR) 10 MG tablet Take 10 mg by mouth daily.  . verapamil (COVERA HS) 240 MG (CO) 24 hr tablet Take 240 mg by mouth at bedtime.  . vitamin B-12 (CYANOCOBALAMIN)  1000 MCG tablet Take 1,000 mcg by mouth daily.  . [DISCONTINUED] TOUJEO SOLOSTAR 300 UNIT/ML SOPN INJECT 24 UNITS SUBCUTANEOUSLY AT BEDTIME   No facility-administered encounter medications on file as of 01/29/2016.    ALLERGIES: No Known Allergies VACCINATION STATUS:  There is no immunization history on file for this patient.  Diabetes  She presents for her follow-up diabetic visit. She has type 2 diabetes mellitus. Onset time: He was diagnosed at approximate age of 53 years. Her disease course has been stable. There are no hypoglycemic associated symptoms. Pertinent negatives for hypoglycemia include no confusion, headaches, pallor or seizures. There are no diabetic associated symptoms. Pertinent negatives for diabetes include no chest pain, no polydipsia, no polyphagia and no polyuria. There are no hypoglycemic complications. Symptoms are stable. There are no diabetic complications. Risk factors for coronary artery disease include diabetes mellitus, dyslipidemia, hypertension and tobacco exposure. Current diabetic treatment includes insulin injections and oral agent (dual therapy). Her weight is increasing steadily. She is following a generally unhealthy diet. She has had a previous visit with a dietitian. She rarely participates in exercise. Her breakfast blood glucose range is generally 130-140 mg/dl. Her overall blood glucose range is 130-140 mg/dl. An ACE  inhibitor/angiotensin II receptor blocker is being taken.  Hyperlipidemia  This is a chronic problem. The current episode started more than 1 year ago. Pertinent negatives include no chest pain, myalgias or shortness of breath. Current antihyperlipidemic treatment includes statins.  Hypertension  This is a chronic problem. The current episode started more than 1 year ago. Pertinent negatives include no chest pain, headaches, palpitations or shortness of breath. Risk factors for coronary artery disease include diabetes mellitus and dyslipidemia.      Review of Systems  Constitutional: Negative for unexpected weight change.  HENT: Negative for trouble swallowing and voice change.   Eyes: Negative for visual disturbance.  Respiratory: Negative for cough, shortness of breath and wheezing.   Cardiovascular: Negative for chest pain, palpitations and leg swelling.  Gastrointestinal: Negative for diarrhea, nausea and vomiting.  Endocrine: Negative for cold intolerance, heat intolerance, polydipsia, polyphagia and polyuria.  Musculoskeletal: Negative for arthralgias and myalgias.  Skin: Negative for color change, pallor, rash and wound.  Neurological: Negative for seizures and headaches.  Psychiatric/Behavioral: Negative for confusion and suicidal ideas.    Objective:    BP 126/78   Pulse 83   Ht 5\' 4"  (1.626 m)   Wt 151 lb (68.5 kg)   BMI 25.92 kg/m   Wt Readings from Last 3 Encounters:  01/29/16 151 lb (68.5 kg)  10/23/15 149 lb (67.6 kg)  07/06/15 145 lb (65.8 kg)    Physical Exam  Constitutional: She is oriented to person, place, and time. She appears well-developed.  HENT:  Head: Normocephalic and atraumatic.  Eyes: EOM are normal.  Neck: Normal range of motion. Neck supple. No tracheal deviation present. No thyromegaly present.  Cardiovascular: Normal rate and regular rhythm.   Pulmonary/Chest: Effort normal and breath sounds normal.  Abdominal: Soft. Bowel sounds are normal. There is no tenderness. There is no guarding.  Musculoskeletal: Normal range of motion. She exhibits no edema.  Neurological: She is alert and oriented to person, place, and time. She has normal reflexes. No cranial nerve deficit. Coordination normal.  Skin: Skin is warm and dry. No rash noted. No erythema. No pallor.  Psychiatric: She has a normal mood and affect. Judgment normal.    Results for orders placed or performed in visit on 01/29/16  Lipid panel  Result Value Ref Range   Triglycerides 88 40 - 160 mg/dL   Cholesterol 128 0 - 200  mg/dL   HDL 43 35 - 70 mg/dL   LDL Cholesterol 67 mg/dL  Hemoglobin A1c  Result Value Ref Range   Hemoglobin A1C 7.9       Assessment & Plan:   1. Uncontrolled type 2 diabetes mellitus with complication, with long-term current use of insulin ([place])   - patient remains at a high risk for more acute and chronic complications of diabetes which include CAD, CVA, CKD, retinopathy, and neuropathy. These are all discussed in detail with the patient.  Patient came with Controlled fasting glucose profile, and  recent A1c Is stable at 7.9 % generally improving from 11.2%.  Glucose logs and insulin administration records pertaining to this visit,  to be scanned into patient's records.  Recent labs reviewed.   - I have re-counseled the patient on diet management  by adopting a carbohydrate restricted / protein rich  Diet.  - Suggestion is made for patient to avoid simple carbohydrates   from their diet including Cakes , Desserts, Ice Cream,  Soda (  diet and regular) , Sweet Tea , Candies,  Chips, Cookies, Artificial Sweeteners,   and "Sugar-free" Products .  This will help patient to have stable blood glucose profile and potentially avoid unintended  Weight gain.  - Patient is advised to stick to a routine mealtimes to eat 3 meals  a day and avoid unnecessary snacks ( to snack only to correct hypoglycemia).  - The patient  has been  scheduled with Jearld Fenton, RDN, CDE for individualized DM education.  - I have approached patient with the following individualized plan to manage diabetes and patient agrees.  - I will increase her basal insulin Toujeo  To 24 units QHS, associated with strict monitoring of glucose  twice a day. -Based on her presentation she will not need prandial insulin for now.  -Patient is encouraged to call clinic for blood glucose levels less than 70 or above 300 mg /dl. - I will continue forming 1000 mg by mouth twice a day and Jardiance 10 mg by mouth every morning,  therapeutically suitable for patient.   - She is not a good candidate for incretin therapy . - Patient specific target  for A1c; LDL, HDL, Triglycerides, and  Waist Circumference were discussed in detail.  2) BP/HTN: Controlled. Continue current medications including ACEI/ARB. 3) Lipids/HPL:  continue statins. 4)  Weight/Diet: CDE consult in progress, exercise, and carbohydrates information provided.  5) Chronic Care/Health Maintenance:  -Patient is on ACEI/ARB and Statin medications and encouraged to continue to follow up with Ophthalmology, Podiatrist at least yearly or according to recommendations, and advised to  stay away from smoking. I have recommended yearly flu vaccine and pneumonia vaccination at least every 5 years; moderate intensity exercise for up to 150 minutes weekly; and  sleep for at least 7 hours a day.  - 25 minutes of time was spent on the care of this patient , 50% of which was applied for counseling on diabetes complications and their preventions.  - I advised patient to maintain close follow up with their PCP for primary care needs.  Patient is asked to bring meter and  blood glucose logs during their next visit.   Follow up plan: -Return in about 3 months (around 04/29/2016) for follow up with pre-visit labs, meter, and logs.  Glade Lloyd, MD Phone: [phone]  Fax: [phone]   01/29/2016, 1:29 PM

## 2016-01-29 NOTE — Patient Instructions (Signed)
Advice for weight management -For most of us the best way to lose weight is by diet management. Generally speaking, diet management means restricting carbohydrate consumption to minimum possible (and to unprocessed or minimally processed complex starch) and increasing protein intake (animal or plant source), fruits, and vegetables.  -Sticking to a routine mealtime to eat 3 meals a day and avoiding unnecessary snacks is shown to have a big role in weight control.  -It is better to avoid simple carbohydrates including: Cakes, Desserts, Ice Cream, Soda (diet and regular), Sweet Tea, Candies, Chips, Cookies, Artificial Sweeteners, and "Sugar-free" Products.   -Exercise: 30 minutes a day 3-4 days a week, or 150 minutes a week. Combine stretch, strength, and aerobic activities. You may seek evaluation by your heart doctor prior to initiating exercise if you have high risk for heart disease.  -If you are interested, we can schedule a visit with Taylor Wilkins, RDN, CDE for individualized nutrition education.  

## 2016-02-09 ENCOUNTER — Other Ambulatory Visit: Payer: Self-pay

## 2016-02-09 MED ORDER — EMPAGLIFLOZIN 10 MG PO TABS: 10.0000 mg | ORAL_TABLET | Freq: Every day | ORAL | 3 refills | Status: DC

## 2016-02-13 ENCOUNTER — Encounter: Payer: Self-pay | Admitting: "Endocrinology

## 2016-02-13 ENCOUNTER — Other Ambulatory Visit: Payer: Self-pay

## 2016-02-13 MED ORDER — EMPAGLIFLOZIN 10 MG PO TABS: 10.0000 mg | ORAL_TABLET | Freq: Every day | ORAL | 0 refills | Status: DC

## 2016-04-22 LAB — HEMOGLOBIN A1C: HEMOGLOBIN A1C: 8

## 2016-04-29 ENCOUNTER — Encounter: Payer: Self-pay | Admitting: "Endocrinology

## 2016-04-29 ENCOUNTER — Ambulatory Visit (INDEPENDENT_AMBULATORY_CARE_PROVIDER_SITE_OTHER): Payer: BLUE CROSS/BLUE SHIELD | Admitting: "Endocrinology

## 2016-04-29 VITALS — BP 141/80 | HR 76 | Ht 64.0 in | Wt 151.0 lb

## 2016-04-29 DIAGNOSIS — E118 Type 2 diabetes mellitus with unspecified complications: Secondary | ICD-10-CM

## 2016-04-29 DIAGNOSIS — IMO0002 Reserved for concepts with insufficient information to code with codable children: Secondary | ICD-10-CM

## 2016-04-29 DIAGNOSIS — I10 Essential (primary) hypertension: Secondary | ICD-10-CM | POA: Diagnosis not present

## 2016-04-29 DIAGNOSIS — Z794 Long term (current) use of insulin: Secondary | ICD-10-CM

## 2016-04-29 DIAGNOSIS — E782 Mixed hyperlipidemia: Secondary | ICD-10-CM | POA: Diagnosis not present

## 2016-04-29 DIAGNOSIS — E1165 Type 2 diabetes mellitus with hyperglycemia: Secondary | ICD-10-CM | POA: Diagnosis not present

## 2016-04-29 MED ORDER — EMPAGLIFLOZIN 25 MG PO TABS: 25.0000 mg | ORAL_TABLET | Freq: Every day | ORAL | 2 refills | Status: DC

## 2016-04-29 NOTE — Progress Notes (Signed)
Subjective:    Patient ID: Taylor Wilkins, female    DOB: 12/03/1945, PCP Tillman Abide, FNP   Past Medical History:  Diagnosis Date  . Cancer ([place])    Basal cell on face  . Chest pain 08/2008   normal coronary angiogram  . Diabetes ([place])   . Hyperlipemia   . Hypertension   . 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 Sleep apnea    Past Surgical History:  Procedure Laterality Date  . ABDOMINAL HYSTERECTOMY    . ANKLE SURGERY  2006  . TONSILLECTOMY  1957   Social History   Social History  . Marital status: Married    Spouse name: N/A  . Number of children: N/A  . Years of education: N/A   Social History Main Topics  . Smoking status: Never Smoker  . Smokeless tobacco: Never Used  . Alcohol use None  . Drug use: Unknown  . Sexual activity: Not Asked   Other Topics Concern  . None   Social History Narrative  . None   Outpatient Encounter Prescriptions as of 04/29/2016  Medication Sig  . aspirin 81 MG tablet Take 81 mg by mouth daily.  . B-D UF III MINI PEN NEEDLES 31G X 5 MM MISC USE 1 NEEDLE AS DIRECTED  . Calcium Citrate-Vitamin D (CALCIUM + D PO) Take by mouth.  . cholecalciferol (VITAMIN D) 1000 UNITS tablet Take 1,000 Units by mouth daily.  . Cranberry 400 MG TABS Take 4,200 mg by mouth daily.  . empagliflozin (JARDIANCE) 25 MG TABS tablet Take 25 mg by mouth daily.  . Fish Oil-Cholecalciferol (FISH OIL + D3) 1000-1000 MG-UNIT CAPS Take by mouth.  . fluconazole (DIFLUCAN) 150 MG tablet Take 150 mg by mouth as directed.   . gabapentin (NEURONTIN) 300 MG capsule Take 300 mg by mouth daily.  . Insulin Glargine (TOUJEO SOLOSTAR) 300 UNIT/ML SOPN Inject 24 Units into the skin at bedtime.  . metFORMIN (GLUCOPHAGE) 1000 MG tablet Take 1,000 mg by mouth 2 (two) times daily with a meal.  . rosuvastatin (CRESTOR) 10 MG tablet Take 10 mg by mouth daily.  . verapamil (COVERA HS) 240 MG (CO) 24 hr tablet Take 240 mg by mouth at bedtime.  . vitamin B-12  (CYANOCOBALAMIN) 1000 MCG tablet Take 1,000 mcg by mouth daily.  . [DISCONTINUED] empagliflozin (JARDIANCE) 10 MG TABS tablet Take 10 mg by mouth daily.   No facility-administered encounter medications on file as of 04/29/2016.    ALLERGIES: No Known Allergies VACCINATION STATUS:  There is no immunization history on file for this patient.  Diabetes  She presents for her follow-up diabetic visit. She has type 2 diabetes mellitus. Onset time: He was diagnosed at approximate age of 56 years. Her disease course has been stable. There are no hypoglycemic associated symptoms. Pertinent negatives for hypoglycemia include no confusion, headaches, pallor or seizures. There are no diabetic associated symptoms. Pertinent negatives for diabetes include no chest pain, no polydipsia, no polyphagia and no polyuria. There are no hypoglycemic complications. Symptoms are stable. There are no diabetic complications. Risk factors for coronary artery disease include diabetes mellitus, dyslipidemia, hypertension and tobacco exposure. Current diabetic treatment includes insulin injections and oral agent (dual therapy). Her weight is increasing steadily. She is following a generally unhealthy diet. She has had a previous visit with a dietitian. She rarely participates in exercise. Her breakfast blood glucose range is generally 130-140 mg/dl. Her overall blood glucose range is 130-140 mg/dl. An  ACE inhibitor/angiotensin II receptor blocker is being taken.  Hyperlipidemia  This is a chronic problem. The current episode started more than 1 year ago. Pertinent negatives include no chest pain, myalgias or shortness of breath. Current antihyperlipidemic treatment includes statins.  Hypertension  This is a chronic problem. The current episode started more than 1 year ago. Pertinent negatives include no chest pain, headaches, palpitations or shortness of breath. Risk factors for coronary artery disease include diabetes mellitus and  dyslipidemia.     Review of Systems  Constitutional: Negative for unexpected weight change.  HENT: Negative for trouble swallowing and voice change.   Eyes: Negative for visual disturbance.  Respiratory: Negative for cough, shortness of breath and wheezing.   Cardiovascular: Negative for chest pain, palpitations and leg swelling.  Gastrointestinal: Negative for diarrhea, nausea and vomiting.  Endocrine: Negative for cold intolerance, heat intolerance, polydipsia, polyphagia and polyuria.  Musculoskeletal: Negative for arthralgias and myalgias.  Skin: Negative for color change, pallor, rash and wound.  Neurological: Negative for seizures and headaches.  Psychiatric/Behavioral: Negative for confusion and suicidal ideas.    Objective:    BP (!) 141/80   Pulse 76   Ht 5\' 4"  (1.626 m)   Wt 151 lb (68.5 kg)   BMI 25.92 kg/m   Wt Readings from Last 3 Encounters:  04/29/16 151 lb (68.5 kg)  01/29/16 151 lb (68.5 kg)  10/23/15 149 lb (67.6 kg)    Physical Exam  Constitutional: She is oriented to person, place, and time. She appears well-developed.  HENT:  Head: Normocephalic and atraumatic.  Eyes: EOM are normal.  Neck: Normal range of motion. Neck supple. No tracheal deviation present. No thyromegaly present.  Cardiovascular: Normal rate and regular rhythm.   Pulmonary/Chest: Effort normal and breath sounds normal.  Abdominal: Soft. Bowel sounds are normal. There is no tenderness. There is no guarding.  Musculoskeletal: Normal range of motion. She exhibits no edema.  Neurological: She is alert and oriented to person, place, and time. She has normal reflexes. No cranial nerve deficit. Coordination normal.  Skin: Skin is warm and dry. No rash noted. No erythema. No pallor.  Psychiatric: She has a normal mood and affect. Judgment normal.    Results for orders placed or performed in visit on 04/29/16  Hemoglobin A1c  Result Value Ref Range   Hemoglobin A1C 8       Assessment  & Plan:   1. Uncontrolled type 2 diabetes mellitus with complication, with long-term current use of insulin ([place])   - patient remains at a high risk for more acute and chronic complications of diabetes which include CAD, CVA, CKD, retinopathy, and neuropathy. These are all discussed in detail with the patient.  Patient came with Controlled fasting glucose profile, and  recent A1c Is stable at 8 % generally improving from 11.2%.  Glucose logs and insulin administration records pertaining to this visit,  to be scanned into patient's records.  Recent labs reviewed.   - I have re-counseled the patient on diet management  by adopting a carbohydrate restricted / protein rich  Diet.  - Suggestion is made for patient to avoid simple carbohydrates   from their diet including Cakes , Desserts, Ice Cream,  Soda (  diet and regular) , Sweet Tea , Candies,  Chips, Cookies, Artificial Sweeteners,   and "Sugar-free" Products .  This will help patient to have stable blood glucose profile and potentially avoid unintended  Weight gain.  - Patient is advised to stick to  a routine mealtimes to eat 3 meals  a day and avoid unnecessary snacks ( to snack only to correct hypoglycemia).  - The patient  has been  scheduled with Jearld Fenton, RDN, CDE for individualized DM education.  - I have approached patient with the following individualized plan to manage diabetes and patient agrees.  - I will Continue her basal insulin Toujeo  To 24 units QHS, associated with strict monitoring of glucose  twice a day. -Based on her presentation she will not need prandial insulin for now.  -Patient is encouraged to call clinic for blood glucose levels less than 70 or above 300 mg /dl. - I will continue forming 1000 mg by mouth twice a day. - I will increase Jardiance to 25 mg by mouth every morning, therapeutically suitable for patient.   - She is not a good candidate for incretin therapy . - Patient specific target  for  A1c; LDL, HDL, Triglycerides, and  Waist Circumference were discussed in detail.  2) BP/HTN: Controlled. Continue current medications including ACEI/ARB. 3) Lipids/HPL:  continue statins. 4)  Weight/Diet: CDE consult in progress, exercise, and carbohydrates information provided.  5) Chronic Care/Health Maintenance:  -Patient is on ACEI/ARB and Statin medications and encouraged to continue to follow up with Ophthalmology, Podiatrist at least yearly or according to recommendations, and advised to  stay away from smoking. I have recommended yearly flu vaccine and pneumonia vaccination at least every 5 years; moderate intensity exercise for up to 150 minutes weekly; and  sleep for at least 7 hours a day.  - 25 minutes of time was spent on the care of this patient , 50% of which was applied for counseling on diabetes complications and their preventions.  - I advised patient to maintain close follow up with their PCP for primary care needs.  Patient is asked to bring meter and  blood glucose logs during their next visit.   Follow up plan: -Return in about 3 months (around 07/28/2016) for follow up with pre-visit labs, meter, and logs.  Glade Lloyd, MD Phone: [phone]  Fax: [phone]   04/29/2016, 11:02 AM

## 2016-07-19 LAB — HEMOGLOBIN A1C: Hemoglobin A1C: 6.6

## 2016-07-29 ENCOUNTER — Ambulatory Visit (INDEPENDENT_AMBULATORY_CARE_PROVIDER_SITE_OTHER): Payer: BLUE CROSS/BLUE SHIELD | Admitting: "Endocrinology

## 2016-07-29 ENCOUNTER — Encounter: Payer: Self-pay | Admitting: "Endocrinology

## 2016-07-29 VITALS — BP 127/78 | HR 83 | Ht 64.0 in | Wt 153.0 lb

## 2016-07-29 DIAGNOSIS — Z794 Long term (current) use of insulin: Secondary | ICD-10-CM

## 2016-07-29 DIAGNOSIS — E118 Type 2 diabetes mellitus with unspecified complications: Secondary | ICD-10-CM | POA: Diagnosis not present

## 2016-07-29 DIAGNOSIS — I10 Essential (primary) hypertension: Secondary | ICD-10-CM | POA: Diagnosis not present

## 2016-07-29 DIAGNOSIS — E782 Mixed hyperlipidemia: Secondary | ICD-10-CM

## 2016-07-29 DIAGNOSIS — IMO0002 Reserved for concepts with insufficient information to code with codable children: Secondary | ICD-10-CM

## 2016-07-29 DIAGNOSIS — E1165 Type 2 diabetes mellitus with hyperglycemia: Secondary | ICD-10-CM | POA: Diagnosis not present

## 2016-07-29 LAB — HM DIABETES EYE EXAM

## 2016-07-29 MED ORDER — EMPAGLIFLOZIN 25 MG PO TABS: 25.0000 mg | ORAL_TABLET | Freq: Every day | ORAL | 2 refills | Status: DC

## 2016-07-29 MED ORDER — INSULIN PEN NEEDLE 32G X 4 MM MISC: 1.0000 | Freq: Four times a day (QID) | 3 refills | Status: AC

## 2016-07-29 NOTE — Progress Notes (Signed)
Subjective:    Patient ID: Taylor Wilkins, female    DOB: Jul 12, 1945, PCP Tillman Abide, FNP   Past Medical History:  Diagnosis Date  . Cancer ([place])    Basal cell on face  . Chest pain 08/2008   normal coronary angiogram  . Diabetes ([place])   . Hyperlipemia   . Hypertension   . 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 Sleep apnea    Past Surgical History:  Procedure Laterality Date  . ABDOMINAL HYSTERECTOMY    . ANKLE SURGERY  2006  . TONSILLECTOMY  1957   Social History   Social History  . Marital status: Married    Spouse name: N/A  . Number of children: N/A  . Years of education: N/A   Social History Main Topics  . Smoking status: Never Smoker  . Smokeless tobacco: Never Used  . Alcohol use Not on file  . Drug use: Unknown  . Sexual activity: Not on file   Other Topics Concern  . Not on file   Social History Narrative  . No narrative on file   Outpatient Encounter Prescriptions as of 07/29/2016  Medication Sig  . aspirin 81 MG tablet Take 81 mg by mouth daily.  . B-D UF III MINI PEN NEEDLES 31G X 5 MM MISC USE 1 NEEDLE AS DIRECTED  . Calcium Citrate-Vitamin D (CALCIUM + D PO) Take by mouth.  . cholecalciferol (VITAMIN D) 1000 UNITS tablet Take 1,000 Units by mouth daily.  . Cranberry 400 MG TABS Take 4,200 mg by mouth daily.  . empagliflozin (JARDIANCE) 25 MG TABS tablet Take 25 mg by mouth daily.  . Fish Oil-Cholecalciferol (FISH OIL + D3) 1000-1000 MG-UNIT CAPS Take by mouth.  . gabapentin (NEURONTIN) 300 MG capsule Take 300 mg by mouth daily.  . Insulin Glargine (TOUJEO SOLOSTAR) 300 UNIT/ML SOPN Inject 24 Units into the skin at bedtime.  . Insulin Pen Needle (BD PEN NEEDLE NANO U/F) 32G X 4 MM MISC 1 each by Does not apply route 4 (four) times daily.  . metFORMIN (GLUCOPHAGE) 1000 MG tablet Take 1,000 mg by mouth 2 (two) times daily with a meal.  . rosuvastatin (CRESTOR) 10 MG tablet Take 10 mg by mouth daily.  . verapamil (COVERA HS) 240 MG (CO) 24 hr  tablet Take 240 mg by mouth at bedtime.  . vitamin B-12 (CYANOCOBALAMIN) 1000 MCG tablet Take 1,000 mcg by mouth daily.  . [DISCONTINUED] empagliflozin (JARDIANCE) 25 MG TABS tablet Take 25 mg by mouth daily.  . [DISCONTINUED] fluconazole (DIFLUCAN) 150 MG tablet Take 150 mg by mouth as directed.    No facility-administered encounter medications on file as of 07/29/2016.    ALLERGIES: No Known Allergies VACCINATION STATUS:  There is no immunization history on file for this patient.  Diabetes  She presents for her follow-up diabetic visit. She has type 2 diabetes mellitus. Onset time: He was diagnosed at approximate age of 31 years. Her disease course has been improving. There are no hypoglycemic associated symptoms. Pertinent negatives for hypoglycemia include no confusion, headaches, pallor or seizures. There are no diabetic associated symptoms. Pertinent negatives for diabetes include no chest pain, no polydipsia, no polyphagia and no polyuria. There are no hypoglycemic complications. Symptoms are improving. Diabetic complications include retinopathy. Risk factors for coronary artery disease include diabetes mellitus, dyslipidemia, hypertension and tobacco exposure. Current diabetic treatment includes insulin injections and oral agent (dual therapy). Her weight is increasing steadily. She is following a generally  unhealthy diet. She has had a previous visit with a dietitian. She rarely participates in exercise. Her breakfast blood glucose range is generally 130-140 mg/dl. Her overall blood glucose range is 130-140 mg/dl. An ACE inhibitor/angiotensin II receptor blocker is being taken.  Hyperlipidemia  This is a chronic problem. The current episode started more than 1 year ago. Pertinent negatives include no chest pain, myalgias or shortness of breath. Current antihyperlipidemic treatment includes statins.  Hypertension  This is a chronic problem. The current episode started more than 1 year ago.  Pertinent negatives include no chest pain, headaches, palpitations or shortness of breath. Risk factors for coronary artery disease include diabetes mellitus and dyslipidemia. Hypertensive end-organ damage includes retinopathy.     Review of Systems  Constitutional: Negative for unexpected weight change.  HENT: Negative for trouble swallowing and voice change.   Eyes: Negative for visual disturbance.  Respiratory: Negative for cough, shortness of breath and wheezing.   Cardiovascular: Negative for chest pain, palpitations and leg swelling.  Gastrointestinal: Negative for diarrhea, nausea and vomiting.  Endocrine: Negative for cold intolerance, heat intolerance, polydipsia, polyphagia and polyuria.  Musculoskeletal: Negative for arthralgias and myalgias.  Skin: Negative for color change, pallor, rash and wound.  Neurological: Negative for seizures and headaches.  Psychiatric/Behavioral: Negative for confusion and suicidal ideas.    Objective:    BP 127/78   Pulse 83   Ht 5\' 4"  (1.626 m)   Wt 153 lb (69.4 kg)   BMI 26.26 kg/m   Wt Readings from Last 3 Encounters:  07/29/16 153 lb (69.4 kg)  04/29/16 151 lb (68.5 kg)  01/29/16 151 lb (68.5 kg)    Physical Exam  Constitutional: She is oriented to person, place, and time. She appears well-developed.  HENT:  Head: Normocephalic and atraumatic.  Eyes: EOM are normal.  Neck: Normal range of motion. Neck supple. No tracheal deviation present. No thyromegaly present.  Cardiovascular: Normal rate and regular rhythm.   Pulmonary/Chest: Effort normal and breath sounds normal.  Abdominal: Soft. Bowel sounds are normal. There is no tenderness. There is no guarding.  Musculoskeletal: Normal range of motion. She exhibits no edema.  Neurological: She is alert and oriented to person, place, and time. She has normal reflexes. No cranial nerve deficit. Coordination normal.  Skin: Skin is warm and dry. No rash noted. No erythema. No pallor.   Psychiatric: She has a normal mood and affect. Judgment normal.    Results for orders placed or performed in visit on 07/29/16  Hemoglobin A1c  Result Value Ref Range   Hemoglobin A1C 6.6   HM DIABETES EYE EXAM  Result Value Ref Range   HM Diabetic Eye Exam Retinopathy (A) No Retinopathy      Assessment & Plan:   1. Uncontrolled type 2 diabetes mellitus with complication, with long-term current use of insulin ([place])   - patient remains at a high risk for more acute and chronic complications of diabetes which include CAD, CVA, CKD, retinopathy, and neuropathy. These are all discussed in detail with the patient.  Patient came with Controlled fasting glucose profile, and  recent A1c Has improved to 6.6%, generally improving from 11.2%.  Glucose logs and insulin administration records pertaining to this visit,  to be scanned into patient's records.  Recent labs reviewed.   - I have re-counseled the patient on diet management  by adopting a carbohydrate restricted / protein rich  Diet.  - Suggestion is made for patient to avoid simple carbohydrates   from  their diet including Cakes , Desserts, Ice Cream,  Soda (  diet and regular) , Sweet Tea , Candies,  Chips, Cookies, Artificial Sweeteners,   and "Sugar-free" Products .  This will help patient to have stable blood glucose profile and potentially avoid unintended  Weight gain.  - Patient is advised to stick to a routine mealtimes to eat 3 meals  a day and avoid unnecessary snacks ( to snack only to correct hypoglycemia).  - The patient  has been  scheduled with Jearld Fenton, RDN, CDE for individualized DM education.  - I have approached patient with the following individualized plan to manage diabetes and patient agrees.  - I will lower her basal insulin Toujeo to 20 units QHS, associated with strict monitoring of glucose  twice a day. -Based on her presentation she will not need prandial insulin for now.  -Patient is encouraged  to call clinic for blood glucose levels less than 70 or above 300 mg /dl. - I will continue metformin 1000 mg by mouth twice a day. - I will until you Jardiance  25 mg by mouth every morning, therapeutically suitable for patient.   - She is not a good candidate for incretin therapy . - Patient specific target  for A1c; LDL, HDL, Triglycerides, and  Waist Circumference were discussed in detail.  2) BP/HTN: Controlled. Continue current medications including ACEI/ARB. 3) Lipids/HPL:  continue statins. 4)  Weight/Diet: CDE consult in progress, exercise, and carbohydrates information provided.  5) Chronic Care/Health Maintenance:  -Patient is on ACEI/ARB and Statin medications and encouraged to continue to follow up with Ophthalmology, Podiatrist at least yearly or according to recommendations, and advised to  stay away from smoking. I have recommended yearly flu vaccine and pneumonia vaccination at least every 5 years; moderate intensity exercise for up to 150 minutes weekly; and  sleep for at least 7 hours a day.  - 25 minutes of time was spent on the care of this patient , 50% of which was applied for counseling on diabetes complications and their preventions.  - I advised patient to maintain close follow up with their PCP for primary care needs.  Patient is asked to bring meter and  blood glucose logs during their next visit.   Follow up plan: -Return in about 3 months (around 10/29/2016) for meter, and logs.  Glade Lloyd, MD Phone: [phone]  Fax: [phone]   07/29/2016, 11:16 AM

## 2016-07-29 NOTE — Patient Instructions (Signed)

## 2016-09-11 ENCOUNTER — Encounter (INDEPENDENT_AMBULATORY_CARE_PROVIDER_SITE_OTHER): Payer: BLUE CROSS/BLUE SHIELD | Admitting: Ophthalmology

## 2016-09-11 DIAGNOSIS — H2513 Age-related nuclear cataract, bilateral: Secondary | ICD-10-CM

## 2016-09-11 DIAGNOSIS — E113393 Type 2 diabetes mellitus with moderate nonproliferative diabetic retinopathy without macular edema, bilateral: Secondary | ICD-10-CM | POA: Diagnosis not present

## 2016-09-11 DIAGNOSIS — E11319 Type 2 diabetes mellitus with unspecified diabetic retinopathy without macular edema: Secondary | ICD-10-CM | POA: Diagnosis not present

## 2016-09-11 DIAGNOSIS — I10 Essential (primary) hypertension: Secondary | ICD-10-CM | POA: Diagnosis not present

## 2016-09-11 DIAGNOSIS — H35033 Hypertensive retinopathy, bilateral: Secondary | ICD-10-CM | POA: Diagnosis not present

## 2016-09-11 DIAGNOSIS — H43813 Vitreous degeneration, bilateral: Secondary | ICD-10-CM

## 2016-10-21 ENCOUNTER — Telehealth: Payer: Self-pay | Admitting: Cardiovascular Disease

## 2016-10-21 NOTE — Telephone Encounter (Signed)
10/21/2016 Received faxed referral packet from [facility] for upcoming appointment with Dr. Gwenlyn Found on 10/25/2016 at 9:00 am.  Records given to [facility].  cbr

## 2016-10-22 LAB — BASIC METABOLIC PANEL
BUN: 19 (ref 4–21)
Creatinine: 0.8 (ref <=1.1)

## 2016-10-22 LAB — HEMOGLOBIN A1C: Hemoglobin A1C: 7.8

## 2016-10-25 ENCOUNTER — Ambulatory Visit (INDEPENDENT_AMBULATORY_CARE_PROVIDER_SITE_OTHER): Payer: BLUE CROSS/BLUE SHIELD | Admitting: Cardiovascular Disease

## 2016-10-25 ENCOUNTER — Encounter (INDEPENDENT_AMBULATORY_CARE_PROVIDER_SITE_OTHER): Payer: Self-pay

## 2016-10-25 ENCOUNTER — Encounter: Payer: Self-pay | Admitting: Cardiovascular Disease

## 2016-10-25 VITALS — BP 132/60 | HR 84 | Ht 64.0 in | Wt 147.4 lb

## 2016-10-25 DIAGNOSIS — E7800 Pure hypercholesterolemia, unspecified: Secondary | ICD-10-CM

## 2016-10-25 DIAGNOSIS — I10 Essential (primary) hypertension: Secondary | ICD-10-CM | POA: Diagnosis not present

## 2016-10-25 DIAGNOSIS — R079 Chest pain, unspecified: Secondary | ICD-10-CM

## 2016-10-25 DIAGNOSIS — R0609 Other forms of dyspnea: Secondary | ICD-10-CM | POA: Diagnosis not present

## 2016-10-25 NOTE — Assessment & Plan Note (Signed)
History of hyperlipidemia on statin therapy followed by her PCP. 

## 2016-10-25 NOTE — Assessment & Plan Note (Signed)
New-onset dyspnea on exertion over the last several months. She is not a smoker. She does have multiple cardiac risk factors. This may be an anginal equivalent. He did have a negative heart cath 5-6 years ago by Dr. Elisabeth Cara after a false positive GXT. We will obtain a. 2-D echo and exercise Myoview stress test to risk stratify.

## 2016-10-25 NOTE — Assessment & Plan Note (Signed)
History of essential hypertension with blood pressure measured today at 132/60. She is on verapamil. Continue current meds current dosing

## 2016-10-25 NOTE — Patient Instructions (Signed)
Medication Instructions: Your physician recommends that you continue on your current medications as directed. Please refer to the Current Medication list given to you today.   Testing/Procedures: Your physician has requested that you have an exercise stress myoview. For further information please visit www.[email or web address]. Please follow instruction sheet, as given.  Your physician has requested that you have an echocardiogram. Echocardiography is a painless test that uses sound waves to create images of your heart. It provides your doctor with information about the size and shape of your heart and how well your heart's chambers and valves are working. This procedure takes approximately one hour. There are no restrictions for this procedure.  Follow-Up: Your physician wants you to follow-up in: 6 months with Dr. Berry. You will receive a reminder letter in the mail two months in advance. If you don't receive a letter, please call our office to schedule the follow-up appointment.  If you need a refill on your cardiac medications before your next appointment, please call your pharmacy.  

## 2016-10-25 NOTE — Progress Notes (Signed)
10/25/2016 JACKILYN UMPHLETT   03-Jul-1945  [id number]  Primary Physician Tillman Abide, [place] Primary Cardiologist: Lorretta Harp MD Renae Gloss  HPI:  Ms. Alfredo is a delightful 71 year old married Caucasian female mother of 63, grandmother and 6 grandchildren referred by Leandrew Koyanagi FNP in [place] to be reestablished our practice. She had previously seen Dr. Elisabeth Cara in our practice years ago and apparently had a normal cardiac catheterization after false positive GXT. She does work as a Teacher, early years/pre. She has cardiac risk factors notable for 2 hypertension and hyperlipidemia as well as diabetes. She does not smoke. Her mother did die of a myocardial infarction at age 27. She has new-onset dyspnea on exertion over the last several months. She denies chest pain.   Current Outpatient Prescriptions  Medication Sig Dispense Refill  . aspirin 81 MG tablet Take 81 mg by mouth daily.    . B-D UF III MINI PEN NEEDLES 31G X 5 MM MISC USE 1 NEEDLE AS DIRECTED 270 each 2  . Calcium Citrate-Vitamin D (CALCIUM + D PO) Take by mouth.    . cholecalciferol (VITAMIN D) 1000 UNITS tablet Take 1,000 Units by mouth daily.    . Cranberry 400 MG TABS Take 4,200 mg by mouth daily.    . empagliflozin (JARDIANCE) 25 MG TABS tablet Take 25 mg by mouth daily. 30 tablet 2  . Fish Oil-Cholecalciferol (FISH OIL + D3) 1000-1000 MG-UNIT CAPS Take by mouth.    . fluconazole (DIFLUCAN) 150 MG tablet Take 1 tablet by mouth as directed.    . gabapentin (NEURONTIN) 300 MG capsule Take 300 mg by mouth daily.    . Insulin Glargine (TOUJEO SOLOSTAR) 300 UNIT/ML SOPN Inject 24 Units into the skin at bedtime. (Patient taking differently: Inject 20 Units into the skin at bedtime. ) 15 mL 2  . Insulin Pen Needle (BD PEN NEEDLE NANO U/F) 32G X 4 MM MISC 1 each by Does not apply route 4 (four) times daily. 100 each 3  . metFORMIN (GLUCOPHAGE) 1000 MG tablet Take 1,000 mg by mouth 2 (two) times daily with  a meal.    . pyridoxine (B-6) 100 MG tablet Take 100 mg by mouth daily.    . rosuvastatin (CRESTOR) 10 MG tablet Take 10 mg by mouth daily.    . verapamil (COVERA HS) 240 MG (CO) 24 hr tablet Take 240 mg by mouth at bedtime.    . vitamin B-12 (CYANOCOBALAMIN) 1000 MCG tablet Take 1,000 mcg by mouth daily.     No current facility-administered medications for this visit.     No Known Allergies  Social History   Social History  . Marital status: Married    Spouse name: N/A  . Number of children: N/A  . Years of education: N/A   Occupational History  . Not on file.   Social History Main Topics  . Smoking status: Never Smoker  . Smokeless tobacco: Never Used  . Alcohol use Not on file  . Drug use: Unknown  . Sexual activity: Not on file   Other Topics Concern  . Not on file   Social History Narrative  . No narrative on file     Review of Systems: General: negative for chills, fever, night sweats or weight changes.  Cardiovascular: negative for chest pain, dyspnea on exertion, edema, orthopnea, palpitations, paroxysmal nocturnal dyspnea or shortness of breath Dermatological: negative for rash Respiratory: negative for cough or wheezing Urologic: negative for hematuria Abdominal:  negative for nausea, vomiting, diarrhea, bright red blood per rectum, melena, or hematemesis Neurologic: negative for visual changes, syncope, or dizziness All other systems reviewed and are otherwise negative except as noted above.    Blood pressure 132/60, pulse 84, height 5\' 4"  (1.626 m), weight 147 lb 6.4 oz (66.9 kg).  General appearance: alert and no distress Neck: no adenopathy, no carotid bruit, no JVD, supple, symmetrical, trachea midline and thyroid not enlarged, symmetric, no tenderness/mass/nodules Lungs: clear to auscultation bilaterally Heart: regular rate and rhythm, S1, S2 normal, no murmur, click, rub or gallop Extremities: extremities normal, atraumatic, no cyanosis or  edema  EKG sinus rhythm at 84 without ST or T-wave changes. I personally reviewed this EKG  ASSESSMENT AND PLAN:   Hyperlipidemia History of hyperlipidemia on statin therapy followed by her PCP  Essential hypertension, benign History of essential hypertension with blood pressure measured today at 132/60. She is on verapamil. Continue current meds current dosing  Dyspnea on exertion New-onset dyspnea on exertion over the last several months. She is not a smoker. She does have multiple cardiac risk factors. This may be an anginal equivalent. He did have a negative heart cath 5-6 years ago by Dr. Elisabeth Cara after a false positive GXT. We will obtain a. 2-D echo and exercise Myoview stress test to risk stratify.      Lorretta Harp MD FACP,FACC,FAHA, [facility] 10/25/2016 9:01 AM

## 2016-10-29 ENCOUNTER — Ambulatory Visit (INDEPENDENT_AMBULATORY_CARE_PROVIDER_SITE_OTHER): Payer: BLUE CROSS/BLUE SHIELD | Admitting: "Endocrinology

## 2016-10-29 ENCOUNTER — Encounter: Payer: Self-pay | Admitting: "Endocrinology

## 2016-10-29 VITALS — BP 133/70 | HR 79 | Ht 64.0 in | Wt 146.0 lb

## 2016-10-29 DIAGNOSIS — E782 Mixed hyperlipidemia: Secondary | ICD-10-CM | POA: Diagnosis not present

## 2016-10-29 DIAGNOSIS — E1165 Type 2 diabetes mellitus with hyperglycemia: Secondary | ICD-10-CM

## 2016-10-29 DIAGNOSIS — E118 Type 2 diabetes mellitus with unspecified complications: Secondary | ICD-10-CM | POA: Diagnosis not present

## 2016-10-29 DIAGNOSIS — IMO0002 Reserved for concepts with insufficient information to code with codable children: Secondary | ICD-10-CM

## 2016-10-29 DIAGNOSIS — I10 Essential (primary) hypertension: Secondary | ICD-10-CM

## 2016-10-29 DIAGNOSIS — Z794 Long term (current) use of insulin: Secondary | ICD-10-CM

## 2016-10-29 MED ORDER — INSULIN GLARGINE 300 UNIT/ML ~~LOC~~ SOPN: 25.0000 [IU] | PEN_INJECTOR | Freq: Every day | SUBCUTANEOUS | 2 refills | Status: DC

## 2016-10-29 MED ORDER — INSULIN PEN NEEDLE 31G X 8 MM MISC: 1.0000 | 3 refills | Status: DC

## 2016-10-29 NOTE — Progress Notes (Signed)
Subjective:    Patient ID: Taylor Wilkins, female    DOB: 1946-02-05, PCP Tillman Abide, FNP   Past Medical History:  Diagnosis Date  . Cancer ([place])    Basal cell on face  . Chest pain 08/2008   normal coronary angiogram  . Diabetes ([place])   . Hyperlipemia   . Hypertension   . 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 Sleep apnea    Past Surgical History:  Procedure Laterality Date  . ABDOMINAL HYSTERECTOMY    . ANKLE SURGERY  2006  . TONSILLECTOMY  1957   Social History   Social History  . Marital status: Married    Spouse name: N/A  . Number of children: N/A  . Years of education: N/A   Social History Main Topics  . Smoking status: Never Smoker  . Smokeless tobacco: Never Used  . Alcohol use None  . Drug use: Unknown  . Sexual activity: Not Asked   Other Topics Concern  . None   Social History Narrative  . None   Outpatient Encounter Prescriptions as of 10/29/2016  Medication Sig  . aspirin 81 MG tablet Take 81 mg by mouth daily.  . B-D UF III MINI PEN NEEDLES 31G X 5 MM MISC USE 1 NEEDLE AS DIRECTED  . Calcium Citrate-Vitamin D (CALCIUM + D PO) Take by mouth.  . cholecalciferol (VITAMIN D) 1000 UNITS tablet Take 1,000 Units by mouth daily.  . Cranberry 400 MG TABS Take 4,200 mg by mouth daily.  . Fish Oil-Cholecalciferol (FISH OIL + D3) 1000-1000 MG-UNIT CAPS Take by mouth.  . fluconazole (DIFLUCAN) 150 MG tablet Take 1 tablet by mouth as directed.  . gabapentin (NEURONTIN) 300 MG capsule Take 300 mg by mouth daily.  . Insulin Glargine (TOUJEO SOLOSTAR) 300 UNIT/ML SOPN Inject 25 Units into the skin at bedtime.  . Insulin Pen Needle (B-D ULTRAFINE III SHORT PEN) 31G X 8 MM MISC 1 each by Does not apply route as directed.  . Insulin Pen Needle (BD PEN NEEDLE NANO U/F) 32G X 4 MM MISC 1 each by Does not apply route 4 (four) times daily.  . metFORMIN (GLUCOPHAGE) 1000 MG tablet Take 1,000 mg by mouth 2 (two) times daily with a meal.  . pyridoxine (B-6) 100 MG  tablet Take 100 mg by mouth daily.  . rosuvastatin (CRESTOR) 10 MG tablet Take 10 mg by mouth daily.  . verapamil (COVERA HS) 240 MG (CO) 24 hr tablet Take 240 mg by mouth at bedtime.  . vitamin B-12 (CYANOCOBALAMIN) 1000 MCG tablet Take 1,000 mcg by mouth daily.  . [DISCONTINUED] empagliflozin (JARDIANCE) 25 MG TABS tablet Take 25 mg by mouth daily.  . [DISCONTINUED] Insulin Glargine (TOUJEO SOLOSTAR) 300 UNIT/ML SOPN Inject 24 Units into the skin at bedtime. (Patient taking differently: Inject 20 Units into the skin at bedtime. )   No facility-administered encounter medications on file as of 10/29/2016.    ALLERGIES: No Known Allergies VACCINATION STATUS:  There is no immunization history on file for this patient.  Diabetes  She presents for her follow-up diabetic visit. She has type 2 diabetes mellitus. Onset time: He was diagnosed at approximate age of 58 years. Her disease course has been worsening. There are no hypoglycemic associated symptoms. Pertinent negatives for hypoglycemia include no confusion, headaches, pallor or seizures. There are no diabetic associated symptoms. Pertinent negatives for diabetes include no chest pain, no polydipsia, no polyphagia and no polyuria. There are no hypoglycemic  complications. Symptoms are worsening. Diabetic complications include retinopathy. Risk factors for coronary artery disease include diabetes mellitus, dyslipidemia, hypertension and tobacco exposure. Current diabetic treatment includes insulin injections and oral agent (dual therapy). Her weight is decreasing steadily. She is following a generally unhealthy diet. She has had a previous visit with a dietitian. She rarely participates in exercise. Her breakfast blood glucose range is generally 130-140 mg/dl. Her overall blood glucose range is 130-140 mg/dl. An ACE inhibitor/angiotensin II receptor blocker is being taken.  Hyperlipidemia  This is a chronic problem. The current episode started more  than 1 year ago. Pertinent negatives include no chest pain, myalgias or shortness of breath. Current antihyperlipidemic treatment includes statins.  Hypertension  This is a chronic problem. The current episode started more than 1 year ago. Pertinent negatives include no chest pain, headaches, palpitations or shortness of breath. Risk factors for coronary artery disease include diabetes mellitus and dyslipidemia. Hypertensive end-organ damage includes retinopathy.     Review of Systems  Constitutional: Negative for unexpected weight change.  HENT: Negative for trouble swallowing and voice change.   Eyes: Negative for visual disturbance.  Respiratory: Negative for cough, shortness of breath and wheezing.   Cardiovascular: Negative for chest pain, palpitations and leg swelling.  Gastrointestinal: Negative for diarrhea, nausea and vomiting.  Endocrine: Negative for cold intolerance, heat intolerance, polydipsia, polyphagia and polyuria.  Musculoskeletal: Negative for arthralgias and myalgias.  Skin: Negative for color change, pallor, rash and wound.  Neurological: Negative for seizures and headaches.  Psychiatric/Behavioral: Negative for confusion and suicidal ideas.    Objective:    BP 133/70   Pulse 79   Ht 5\' 4"  (1.626 m)   Wt 146 lb (66.2 kg)   BMI 25.06 kg/m   Wt Readings from Last 3 Encounters:  10/29/16 146 lb (66.2 kg)  10/25/16 147 lb 6.4 oz (66.9 kg)  07/29/16 153 lb (69.4 kg)    Physical Exam  Constitutional: She is oriented to person, place, and time. She appears well-developed.  HENT:  Head: Normocephalic and atraumatic.  Eyes: EOM are normal.  Neck: Normal range of motion. Neck supple. No tracheal deviation present. No thyromegaly present.  Cardiovascular: Normal rate and regular rhythm.   Pulmonary/Chest: Effort normal and breath sounds normal.  Abdominal: Soft. Bowel sounds are normal. There is no tenderness. There is no guarding.  Musculoskeletal: Normal range  of motion. She exhibits no edema.  Neurological: She is alert and oriented to person, place, and time. She has normal reflexes. No cranial nerve deficit. Coordination normal.  Skin: Skin is warm and dry. No rash noted. No erythema. No pallor.  Psychiatric: She has a normal mood and affect. Judgment normal.    Results for orders placed or performed in visit on 03/08/24  Basic metabolic panel  Result Value Ref Range   BUN 19 4 - 21   Creatinine 0.8 0.5 - 1.1  Hemoglobin A1c  Result Value Ref Range   Hemoglobin A1C 7.8       Assessment & Plan:   1. Uncontrolled type 2 diabetes mellitus with complication, with long-term current use of insulin ([place])   - patient remains at a high risk for more acute and chronic complications of diabetes which include CAD, CVA, CKD, retinopathy, and neuropathy. These are all discussed in detail with the patient.  Patient came with Controlled fasting glucose profile,  However her A1c is increasing to 7.8% from 6.6%. Her A1c has generally improved from 11.2%.  Glucose logs and insulin  administration records pertaining to this visit,  to be scanned into patient's records.  Recent labs reviewed.   - I have re-counseled the patient on diet management  by adopting a carbohydrate restricted / protein rich  Diet.  - Suggestion is made for patient to avoid simple carbohydrates   from her diet including Cakes , Desserts, Ice Cream,  Soda (  diet and regular) , Sweet Tea , Candies,  Chips, Cookies, Artificial Sweeteners,   and "Sugar-free" Products .  This will help patient to have stable blood glucose profile and potentially avoid unintended  Weight gain.  - Patient is advised to stick to a routine mealtimes to eat 3 meals  a day and avoid unnecessary snacks ( to snack only to correct hypoglycemia).  - The patient  has been  scheduled with Jearld Fenton, RDN, CDE for individualized DM education.  - I have approached patient with the following individualized plan  to manage diabetes and patient agrees.  - I will increase her basal insulin Toujeo to 25 units daily at bedtime, associated with strict monitoring of glucose  twice a day- before breakfast and at bedtime -Based on her presentation she will not need prandial insulin for now.  -Patient is encouraged to call clinic for blood glucose levels less than 70 or above 300 mg /dl. - I will continue metformin 1000 mg by mouth twice a day. -  Due to her unexplained 7 pounds weight loss since her last visit, I advised her to hold  Jardiance   for now.  - She is not a good candidate for incretin therapy . - Patient specific target  for A1c; LDL, HDL, Triglycerides, and  Waist Circumference were discussed in detail.  2) BP/HTN: Controlled. Continue current medications including ACEI/ARB. 3) Lipids/HPL:  continue statins. 4)  Weight/Diet: CDE consult in progress, exercise, and carbohydrates information provided.  5) Chronic Care/Health Maintenance:  -Patient is on ACEI/ARB and Statin medications and encouraged to continue to follow up with Ophthalmology, Podiatrist at least yearly or according to recommendations, and advised to  stay away from smoking. I have recommended yearly flu vaccine and pneumonia vaccination at least every 5 years; moderate intensity exercise for up to 150 minutes weekly; and  sleep for at least 7 hours a day.  - 25 minutes of time was spent on the care of this patient , 50% of which was applied for counseling on diabetes complications and their preventions.  - I advised patient to maintain close follow up with their PCP for primary care needs.  Patient is asked to bring meter and  blood glucose logs during her next visit.   Follow up plan: -Return in about 3 months (around 01/29/2017) for meter, and logs.  Glade Lloyd, MD Phone: [phone]  Fax: [phone]   10/29/2016, 11:55 AM

## 2016-11-06 ENCOUNTER — Telehealth (HOSPITAL_COMMUNITY): Payer: Self-pay

## 2016-11-06 NOTE — Telephone Encounter (Signed)
Encounter complete. 

## 2016-11-08 ENCOUNTER — Ambulatory Visit (HOSPITAL_COMMUNITY)
Admission: RE | Admit: 2016-11-08 | Discharge: 2016-11-08 | Disposition: A | Discharge status: Home / Self Care | Payer: BLUE CROSS/BLUE SHIELD | Source: Ambulatory Visit | Attending: Cardiology | Admitting: Cardiology

## 2016-11-08 ENCOUNTER — Other Ambulatory Visit: Payer: Self-pay

## 2016-11-08 ENCOUNTER — Ambulatory Visit (HOSPITAL_BASED_OUTPATIENT_CLINIC_OR_DEPARTMENT_OTHER): Payer: BLUE CROSS/BLUE SHIELD

## 2016-11-08 DIAGNOSIS — R079 Chest pain, unspecified: Secondary | ICD-10-CM

## 2016-11-08 DIAGNOSIS — R0609 Other forms of dyspnea: Secondary | ICD-10-CM | POA: Diagnosis not present

## 2016-11-08 DIAGNOSIS — I340 Nonrheumatic mitral (valve) insufficiency: Secondary | ICD-10-CM | POA: Diagnosis not present

## 2016-11-08 DIAGNOSIS — E119 Type 2 diabetes mellitus without complications: Secondary | ICD-10-CM | POA: Diagnosis not present

## 2016-11-08 DIAGNOSIS — Z8249 Family history of ischemic heart disease and other diseases of the circulatory system: Secondary | ICD-10-CM | POA: Insufficient documentation

## 2016-11-08 LAB — MYOCARDIAL PERFUSION IMAGING
CHL CUP NUCLEAR SDS: 0
CHL CUP NUCLEAR SRS: 4
CHL CUP NUCLEAR SSS: 4
CSEPEDS: 22 s
CSEPPHR: 146 {beats}/min
Estimated workload: 7.5 METS
Exercise duration (min): 6 min
LVDIAVOL: 105 mL (ref 46–106)
LVSYSVOL: 44 mL
MPHR: 150 {beats}/min
Percent HR: 97 %
RPE: 18
Rest HR: 72 {beats}/min
TID: 1.16

## 2016-11-08 LAB — ECHOCARDIOGRAM COMPLETE
Height: 64 in
Weight: 2352 oz

## 2016-11-08 MED ORDER — TECHNETIUM TC 99M TETROFOSMIN IV KIT
10.7000 | PACK | Freq: Once | INTRAVENOUS | Status: AC | PRN
  Administered 2016-11-08: 10.7 via INTRAVENOUS
  Filled 2016-11-08: qty 11

## 2016-11-08 MED ORDER — TECHNETIUM TC 99M TETROFOSMIN IV KIT
30.4000 | PACK | Freq: Once | INTRAVENOUS | Status: AC | PRN
  Administered 2016-11-08: 30.4 via INTRAVENOUS
  Filled 2016-11-08: qty 31

## 2016-11-18 ENCOUNTER — Telehealth: Payer: Self-pay

## 2016-11-18 NOTE — Telephone Encounter (Signed)
No change for now, bedtime readings are probably a reflection of her late dinner.

## 2016-11-18 NOTE — Telephone Encounter (Signed)
Pt states she has had high BG readings at night.    Date Before breakfast Before lunch Before supper Bedtime  7/5 93   210  7/6 122   257  7/7 99     7/8 138   230   7/9                         119   Pt taking: Metformin 1000mg  bid, Toujeo 25 units qhs

## 2016-11-19 NOTE — Telephone Encounter (Signed)
Pt.notified

## 2016-12-16 ENCOUNTER — Ambulatory Visit (INDEPENDENT_AMBULATORY_CARE_PROVIDER_SITE_OTHER): Payer: BLUE CROSS/BLUE SHIELD | Admitting: "Endocrinology

## 2016-12-16 ENCOUNTER — Encounter: Payer: Self-pay | Admitting: "Endocrinology

## 2016-12-16 VITALS — BP 124/85 | HR 82 | Wt 145.0 lb

## 2016-12-16 DIAGNOSIS — E1165 Type 2 diabetes mellitus with hyperglycemia: Secondary | ICD-10-CM | POA: Diagnosis not present

## 2016-12-16 DIAGNOSIS — I10 Essential (primary) hypertension: Secondary | ICD-10-CM

## 2016-12-16 DIAGNOSIS — IMO0002 Reserved for concepts with insufficient information to code with codable children: Secondary | ICD-10-CM

## 2016-12-16 DIAGNOSIS — E118 Type 2 diabetes mellitus with unspecified complications: Secondary | ICD-10-CM | POA: Diagnosis not present

## 2016-12-16 DIAGNOSIS — E782 Mixed hyperlipidemia: Secondary | ICD-10-CM

## 2016-12-16 DIAGNOSIS — Z794 Long term (current) use of insulin: Secondary | ICD-10-CM | POA: Diagnosis not present

## 2016-12-16 NOTE — Progress Notes (Signed)
Subjective:    Patient ID: Taylor Wilkins, female    DOB: Sep 12, 1945, PCP Tillman Abide, FNP   Past Medical History:  Diagnosis Date  . Cancer ([place])    Basal cell on face  . Chest pain 08/2008   normal coronary angiogram  . Diabetes ([place])   . Hyperlipemia   . Hypertension   . 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 Sleep apnea    Past Surgical History:  Procedure Laterality Date  . ABDOMINAL HYSTERECTOMY    . ANKLE SURGERY  2006  . TONSILLECTOMY  1957   Social History   Social History  . Marital status: Married    Spouse name: N/A  . Number of children: N/A  . Years of education: N/A   Social History Main Topics  . Smoking status: Never Smoker  . Smokeless tobacco: Never Used  . Alcohol use None  . Drug use: Unknown  . Sexual activity: Not Asked   Other Topics Concern  . None   Social History Narrative  . None   Outpatient Encounter Prescriptions as of 12/16/2016  Medication Sig  . allopurinol (ZYLOPRIM) 300 MG tablet Take 300 mg by mouth daily.  Marland Kitchen aspirin 81 MG tablet Take 81 mg by mouth daily.  . B-D UF III MINI PEN NEEDLES 31G X 5 MM MISC USE 1 NEEDLE AS DIRECTED  . Calcium Citrate-Vitamin D (CALCIUM + D PO) Take by mouth.  . cholecalciferol (VITAMIN D) 1000 UNITS tablet Take 1,000 Units by mouth daily.  . Cranberry 400 MG TABS Take 4,200 mg by mouth daily.  . Fish Oil-Cholecalciferol (FISH OIL + D3) 1000-1000 MG-UNIT CAPS Take by mouth.  . fluconazole (DIFLUCAN) 150 MG tablet Take 1 tablet by mouth as directed.  . gabapentin (NEURONTIN) 300 MG capsule Take 300 mg by mouth daily.  . Insulin Glargine 300 UNIT/ML SOPN Inject 40 Units into the skin at bedtime.  . Insulin Pen Needle (B-D ULTRAFINE III SHORT PEN) 31G X 8 MM MISC 1 each by Does not apply route as directed.  . Insulin Pen Needle (BD PEN NEEDLE NANO U/F) 32G X 4 MM MISC 1 each by Does not apply route 4 (four) times daily.  Marland Kitchen pyridoxine (B-6) 100 MG tablet Take 100 mg by mouth daily.  .  rosuvastatin (CRESTOR) 10 MG tablet Take 10 mg by mouth daily.  . verapamil (COVERA HS) 240 MG (CO) 24 hr tablet Take 240 mg by mouth at bedtime.  . vitamin B-12 (CYANOCOBALAMIN) 1000 MCG tablet Take 1,000 mcg by mouth daily.  . [DISCONTINUED] Insulin Glargine (TOUJEO SOLOSTAR) 300 UNIT/ML SOPN Inject 25 Units into the skin at bedtime. (Patient taking differently: Inject 25 Units into the skin 2 (two) times daily. )  . [DISCONTINUED] metFORMIN (GLUCOPHAGE) 1000 MG tablet Take 1,000 mg by mouth 2 (two) times daily with a meal.   No facility-administered encounter medications on file as of 12/16/2016.    ALLERGIES: No Known Allergies VACCINATION STATUS:  There is no immunization history on file for this patient.  Diabetes  She presents for her follow-up diabetic visit. She has type 2 diabetes mellitus. Onset time: He was diagnosed at approximate age of 26 years. Her disease course has been worsening. There are no hypoglycemic associated symptoms. Pertinent negatives for hypoglycemia include no confusion, headaches, pallor or seizures. There are no diabetic associated symptoms. Pertinent negatives for diabetes include no chest pain, no polydipsia, no polyphagia and no polyuria. There are no hypoglycemic complications.  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 She was found to have severe hyperglycemia in to 400s, this necessitated increasing her Toujeo to 50 units daily. ). Diabetic complications include retinopathy. Risk factors for coronary artery disease include diabetes mellitus, dyslipidemia, hypertension and tobacco exposure. Current diabetic treatment includes insulin injections and oral agent (dual therapy). Her weight is decreasing steadily. She is following a generally unhealthy diet. She has had a previous visit with a dietitian. She rarely participates in exercise. Her breakfast blood glucose range is generally 140-180 mg/dl.  Her bedtime blood glucose range is generally 180-200 mg/dl. Her overall blood glucose range is 180-200 mg/dl. An ACE inhibitor/angiotensin II receptor blocker is being taken.  Hyperlipidemia  This is a chronic problem. The current episode started more than 1 year ago. Pertinent negatives include no chest pain, myalgias or shortness of breath. Current antihyperlipidemic treatment includes statins.  Hypertension  This is a chronic problem. The current episode started more than 1 year ago. Pertinent negatives include no chest pain, headaches, palpitations or shortness of breath. Risk factors for coronary artery disease include diabetes mellitus and dyslipidemia. Hypertensive end-organ damage includes retinopathy.     Review of Systems  Constitutional: Negative for unexpected weight change.  HENT: Negative for trouble swallowing and voice change.   Eyes: Negative for visual disturbance.  Respiratory: Negative for cough, shortness of breath and wheezing.   Cardiovascular: Negative for chest pain, palpitations and leg swelling.  Gastrointestinal: Negative for diarrhea, nausea and vomiting.  Endocrine: Negative for cold intolerance, heat intolerance, polydipsia, polyphagia and polyuria.  Musculoskeletal: Negative for arthralgias and myalgias.  Skin: Negative for color change, pallor, rash and wound.  Neurological: Negative for seizures and headaches.  Psychiatric/Behavioral: Negative for confusion and suicidal ideas.    Objective:    BP 124/85   Pulse 82   Wt 145 lb (65.8 kg)   SpO2 99%   BMI 24.89 kg/m   Wt Readings from Last 3 Encounters:  12/16/16 145 lb (65.8 kg)  11/08/16 147 lb (66.7 kg)  10/29/16 146 lb (66.2 kg)    Physical Exam  Constitutional: She is oriented to person, place, and time. She appears well-developed.  HENT:  Head: Normocephalic and atraumatic.  Eyes: EOM are normal.  Neck: Normal range of motion. Neck supple. No tracheal deviation present. No thyromegaly  present.  Cardiovascular: Normal rate and regular rhythm.   Pulmonary/Chest: Effort normal and breath sounds normal.  Abdominal: Soft. Bowel sounds are normal. There is no tenderness. There is no guarding.  Musculoskeletal: Normal range of motion. She exhibits no edema.  Neurological: She is alert and oriented to person, place, and time. She has normal reflexes. No cranial nerve deficit. Coordination normal.  Skin: Skin is warm and dry. No rash noted. No erythema. No pallor.  Psychiatric: She has a normal mood and affect. Judgment normal.    Results for orders placed or performed during the hospital encounter of 11/08/16  Myocardial Perfusion Imaging  Result Value Ref Range   Rest HR 72 bpm   Rest BP 144/77 mmHg   RPE 18    Exercise duration (sec) 22 sec   Percent HR 97 %   Exercise duration (min) 6 min   Estimated workload 7.5 METS   Peak HR 146 bpm   Peak BP 184/68 mmHg   MPHR 150 bpm   SSS 4    SRS 4    SDS 0    TID 1.16  LV sys vol 44 mL   LV dias vol 105 46 - 106 mL      Assessment & Plan:   1. Uncontrolled type 2 diabetes mellitus with complication, with long-term current use of insulin ([place])   - patient remains at a high risk for more acute and chronic complications of diabetes which include CAD, CVA, CKD, retinopathy, and neuropathy. These are all discussed in detail with the patient.  Patient came with Loss of control of glycemia due to steroid initiation related to her recent diagnosis of CLL.    During her last visit her A1c was increasing to 7.8% from 6.6%. After generally improved from 11.2%.  Glucose logs and insulin administration records pertaining to this visit,  to be scanned into patient's records.  Recent labs reviewed.   - I have re-counseled the patient on diet management  by adopting a carbohydrate restricted / protein rich  Diet.  - Suggestion is made for patient to avoid simple carbohydrates   from her diet including Cakes , Desserts, Ice  Cream,  Soda (  diet and regular) , Sweet Tea , Candies,  Chips, Cookies, Artificial Sweeteners,   and "Sugar-free" Products .  This will help patient to have stable blood glucose profile and potentially avoid unintended  Weight gain.  - Patient is advised to stick to a routine mealtimes to eat 3 meals  a day and avoid unnecessary snacks ( to snack only to correct hypoglycemia).    - I have approached patient with the following individualized plan to manage diabetes and patient agrees.  - She has responded to recent adjustment in her basal insulin. I will continue basal insulin Toujeo at 40  units daily at bedtime, associated with strict monitoring of glucose   4 times a day-before meals and at bedtime.  -Patient is encouraged to call clinic for blood glucose levels less than 70 or above 200 mg /dl x 3 readings in a row. - She may need prandial insulin to control glycemia to target. - I will discontinue metformin for now.  - She is not a good candidate for incretin therapy, SGLT2 inhibitors . - Patient specific target  for A1c; LDL, HDL, Triglycerides, and  Waist Circumference were discussed in detail.  2) BP/HTN: Controlled. Continue current medications including ACEI/ARB. 3) Lipids/HPL:  continue statins. 4)  Weight/Diet: CDE consult in progress, exercise, and carbohydrates information provided.  5) Chronic Care/Health Maintenance:  -Patient is on ACEI/ARB and Statin medications and encouraged to continue to follow up with Ophthalmology, Podiatrist at least yearly or according to recommendations, and advised to  stay away from smoking. I have recommended yearly flu vaccine and pneumonia vaccination at least every 5 years; moderate intensity exercise for up to 150 minutes weekly; and  sleep for at least 7 hours a day.  - 20 minutes of time was spent on the care of this patient , 50% of which was applied for counseling on diabetes complications and their preventions.  - I advised patient to  maintain close follow up with their PCP for primary care needs.  Patient is asked to bring meter and  blood glucose logs during her next visit.   Follow up plan: -Return in about 8 weeks (around 02/10/2017) for meter, and logs.  Glade Lloyd, MD Phone: [phone]  Fax: [phone]   12/16/2016, 12:40 PM

## 2016-12-19 ENCOUNTER — Telehealth: Payer: Self-pay | Admitting: "Endocrinology

## 2016-12-19 ENCOUNTER — Other Ambulatory Visit: Payer: Self-pay | Admitting: "Endocrinology

## 2016-12-19 MED ORDER — INSULIN ASPART 100 UNIT/ML FLEXPEN: 10.0000 [IU] | PEN_INJECTOR | Freq: Three times a day (TID) | SUBCUTANEOUS | 2 refills | Status: DC

## 2016-12-19 NOTE — Telephone Encounter (Signed)
She will need Novolog to use with her meals . I am sending a prescription, needs to be informed. She will continue Toujeo 40 units qhs, but 10 units of Novolog 3 times a day with breakfast, lunch, and dinner if her premealtime blood glucose readings are still above 90 mg/dL. She will not need NovoLog if  pre-meal blood glucose readings are below 90 mg/dL.

## 2016-12-19 NOTE — Telephone Encounter (Signed)
Taylor Wilkins is calling stating that her blood sugars are running high even after the increase on Monday  Monday at Lunch 418  before dinner 263 Before bedtime 377 40 units at bedtime  Tuesday before breakfast 134 Lunch 323 Before dinner 168  Before bedtime 271 40 units at bedtime  Wednesday breakfast 98 Lunch 418 Before dinner 263 Before bedtime 377 40 units at bedtime  steriods today with Chemo please advise?

## 2016-12-20 ENCOUNTER — Other Ambulatory Visit: Payer: Self-pay | Admitting: "Endocrinology

## 2016-12-20 MED ORDER — INSULIN LISPRO 100 UNIT/ML (KWIKPEN): 10.0000 [IU] | PEN_INJECTOR | Freq: Three times a day (TID) | SUBCUTANEOUS | 2 refills | Status: DC

## 2016-12-20 NOTE — Progress Notes (Signed)
Spoke with patient informed of med change from Novolog to Humalog per insurance

## 2016-12-20 NOTE — Telephone Encounter (Signed)
LVM attempted to reach patient on [place]; Did leave instruction on Home phone after unable to reach on either phone:    She will need Novolog to use with her meals . I am sending a prescription, needs to be informed. She will continue Toujeo 40 units qhs, but 10 units of Novolog 3 times a day with breakfast, lunch, and dinner if her premealtime blood glucose readings are still above 90 mg/dL. She will not need NovoLog if  pre-meal blood glucose readings are below 90 mg/dL.

## 2017-01-03 ENCOUNTER — Telehealth: Payer: Self-pay | Admitting: "Endocrinology

## 2017-01-03 NOTE — Telephone Encounter (Signed)
Pt.notified

## 2017-01-03 NOTE — Telephone Encounter (Signed)
Taylor Wilkins is calling stating that her blood sugars are running high taking 40 units before bedtime on the steriods, over 200's before dinner and bedtime, a couple of times over 500 and she is concerned, please advise?

## 2017-01-03 NOTE — Telephone Encounter (Signed)
She can increase Humalog to 15 units TIDAC for premeal glucose above 90.

## 2017-01-03 NOTE — Telephone Encounter (Signed)
Pt states she has had high BG readings.   Date Before breakfast Before lunch Before supper Bedtime  7/22 220 283 207 230  7/23 154 292  264  7/24 149             Pt taking: Humalog 10 units TIDAC, Toujeo 40 units qhs. She states she does not have a sliding scale

## 2017-01-17 ENCOUNTER — Telehealth: Payer: Self-pay | Admitting: "Endocrinology

## 2017-01-17 MED ORDER — INSULIN LISPRO 100 UNIT/ML (KWIKPEN): 15.0000 [IU] | PEN_INJECTOR | Freq: Three times a day (TID) | SUBCUTANEOUS | 2 refills | Status: DC

## 2017-01-17 NOTE — Telephone Encounter (Signed)
Taylor Wilkins is asking for a 90 day supply of insulin lispro (HUMALOG KWIKPEN) 100 UNIT/ML KiwkPen 15 u to [facility] , please advise?

## 2017-01-20 ENCOUNTER — Other Ambulatory Visit: Payer: Self-pay

## 2017-01-20 MED ORDER — INSULIN LISPRO 100 UNIT/ML (KWIKPEN): 15.0000 [IU] | PEN_INJECTOR | Freq: Three times a day (TID) | SUBCUTANEOUS | 0 refills | Status: DC

## 2017-01-31 ENCOUNTER — Ambulatory Visit: Payer: BLUE CROSS/BLUE SHIELD | Admitting: "Endocrinology

## 2017-02-06 LAB — BASIC METABOLIC PANEL
BUN: 17 (ref 4–21)
CREATININE: 0.7 (ref <=1.1)

## 2017-02-10 ENCOUNTER — Ambulatory Visit (INDEPENDENT_AMBULATORY_CARE_PROVIDER_SITE_OTHER): Payer: BLUE CROSS/BLUE SHIELD | Admitting: "Endocrinology

## 2017-02-10 ENCOUNTER — Encounter: Payer: Self-pay | Admitting: "Endocrinology

## 2017-02-10 ENCOUNTER — Other Ambulatory Visit: Payer: Self-pay

## 2017-02-10 VITALS — BP 133/82 | HR 78 | Ht 64.0 in | Wt 153.0 lb

## 2017-02-10 DIAGNOSIS — E118 Type 2 diabetes mellitus with unspecified complications: Secondary | ICD-10-CM

## 2017-02-10 DIAGNOSIS — I10 Essential (primary) hypertension: Secondary | ICD-10-CM

## 2017-02-10 DIAGNOSIS — Z794 Long term (current) use of insulin: Secondary | ICD-10-CM

## 2017-02-10 DIAGNOSIS — E1165 Type 2 diabetes mellitus with hyperglycemia: Secondary | ICD-10-CM | POA: Diagnosis not present

## 2017-02-10 DIAGNOSIS — E782 Mixed hyperlipidemia: Secondary | ICD-10-CM | POA: Diagnosis not present

## 2017-02-10 DIAGNOSIS — IMO0002 Reserved for concepts with insufficient information to code with codable children: Secondary | ICD-10-CM

## 2017-02-10 MED ORDER — GLUCOSE BLOOD VI STRP: ORAL_STRIP | 2 refills | Status: DC

## 2017-02-10 MED ORDER — INSULIN PEN NEEDLE 31G X 8 MM MISC: 1.0000 | 3 refills | Status: DC

## 2017-02-10 NOTE — Progress Notes (Signed)
Subjective:    Patient ID: Taylor Wilkins, female    DOB: 25-Sep-1945, PCP Tillman Abide, FNP   Past Medical History:  Diagnosis Date  . Cancer ([place])    Basal cell on face  . Chest pain 08/2008   normal coronary angiogram  . Diabetes ([place])   . Hyperlipemia   . Hypertension   . 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 Sleep apnea    Past Surgical History:  Procedure Laterality Date  . ABDOMINAL HYSTERECTOMY    . ANKLE SURGERY  2006  . TONSILLECTOMY  1957   Social History   Social History  . Marital status: Married    Spouse name: N/A  . Number of children: N/A  . Years of education: N/A   Social History Main Topics  . Smoking status: Never Smoker  . Smokeless tobacco: Never Used  . Alcohol use None  . Drug use: Unknown  . Sexual activity: Not Asked   Other Topics Concern  . None   Social History Narrative  . None   Outpatient Encounter Prescriptions as of 02/10/2017  Medication Sig  . allopurinol (ZYLOPRIM) 300 MG tablet Take 300 mg by mouth daily.  Marland Kitchen aspirin 81 MG tablet Take 81 mg by mouth daily.  . B-D UF III MINI PEN NEEDLES 31G X 5 MM MISC USE 1 NEEDLE AS DIRECTED  . Calcium Citrate-Vitamin D (CALCIUM + D PO) Take by mouth.  . cholecalciferol (VITAMIN D) 1000 UNITS tablet Take 1,000 Units by mouth daily.  . Cranberry 400 MG TABS Take 4,200 mg by mouth daily.  . Fish Oil-Cholecalciferol (FISH OIL + D3) 1000-1000 MG-UNIT CAPS Take by mouth.  . gabapentin (NEURONTIN) 300 MG capsule Take 300 mg by mouth daily.  Marland Kitchen glucose blood (ACCU-CHEK GUIDE) test strip Use as instructed  . Insulin Glargine 300 UNIT/ML SOPN Inject 40 Units into the skin at bedtime.  . insulin lispro (HUMALOG KWIKPEN) 100 UNIT/ML KiwkPen Inject 0.15-0.21 mLs (15-21 Units total) into the skin 3 (three) times daily before meals.  . Insulin Pen Needle (B-D ULTRAFINE III SHORT PEN) 31G X 8 MM MISC 1 each by Does not apply route as directed.  . Insulin Pen Needle (BD PEN NEEDLE NANO U/F) 32G X 4  MM MISC 1 each by Does not apply route 4 (four) times daily.  Marland Kitchen pyridoxine (B-6) 100 MG tablet Take 100 mg by mouth daily.  . rosuvastatin (CRESTOR) 10 MG tablet Take 10 mg by mouth daily.  . verapamil (COVERA HS) 240 MG (CO) 24 hr tablet Take 240 mg by mouth at bedtime.  . vitamin B-12 (CYANOCOBALAMIN) 1000 MCG tablet Take 1,000 mcg by mouth daily.  . [DISCONTINUED] fluconazole (DIFLUCAN) 150 MG tablet Take 1 tablet by mouth as directed.  . [DISCONTINUED] Insulin Pen Needle (B-D ULTRAFINE III SHORT PEN) 31G X 8 MM MISC 1 each by Does not apply route as directed.   No facility-administered encounter medications on file as of 02/10/2017.    ALLERGIES: No Known Allergies VACCINATION STATUS:  There is no immunization history on file for this patient.  Diabetes  She presents for her follow-up diabetic visit. She has type 2 diabetes mellitus. Onset time: He was diagnosed at approximate age of 76 years. Her disease course has been improving. There are no hypoglycemic associated symptoms. Pertinent negatives for hypoglycemia include no confusion, headaches, pallor or seizures. There are no diabetic associated symptoms. Pertinent negatives for diabetes include no chest pain, no polydipsia, no  polyphagia and no polyuria. There are no hypoglycemic complications. 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 She was found to have severe hyperglycemia in to 400s, this necessitated increasing her Toujeo to 50 units daily. ). Diabetic complications include retinopathy. Risk factors for coronary artery disease include diabetes mellitus, dyslipidemia, hypertension and tobacco exposure. Current diabetic treatment includes insulin injections and oral agent (dual therapy). Her weight is increasing steadily. She is following a generally unhealthy diet. She has had a previous visit with a dietitian. She rarely participates in exercise. Her breakfast blood  glucose range is generally 140-180 mg/dl. Her lunch blood glucose range is generally 130-140 mg/dl. Her dinner blood glucose range is generally 130-140 mg/dl. Her bedtime blood glucose range is generally 130-140 mg/dl. Her overall blood glucose range is 140-180 mg/dl. An ACE inhibitor/angiotensin II receptor blocker is being taken.  Hyperlipidemia  This is a chronic problem. The current episode started more than 1 year ago. Pertinent negatives include no chest pain, myalgias or shortness of breath. Current antihyperlipidemic treatment includes statins.  Hypertension  This is a chronic problem. The current episode started more than 1 year ago. Pertinent negatives include no chest pain, headaches, palpitations or shortness of breath. Risk factors for coronary artery disease include diabetes mellitus and dyslipidemia. Hypertensive end-organ damage includes retinopathy.     Review of Systems  Constitutional: Negative for unexpected weight change.  HENT: Negative for trouble swallowing and voice change.   Eyes: Negative for visual disturbance.  Respiratory: Negative for cough, shortness of breath and wheezing.   Cardiovascular: Negative for chest pain, palpitations and leg swelling.  Gastrointestinal: Negative for diarrhea, nausea and vomiting.  Endocrine: Negative for cold intolerance, heat intolerance, polydipsia, polyphagia and polyuria.  Musculoskeletal: Negative for arthralgias and myalgias.  Skin: Negative for color change, pallor, rash and wound.  Neurological: Negative for seizures and headaches.  Psychiatric/Behavioral: Negative for confusion and suicidal ideas.    Objective:    BP 133/82   Pulse 78   Ht 5\' 4"  (1.626 m)   Wt 153 lb (69.4 kg)   BMI 26.26 kg/m   Wt Readings from Last 3 Encounters:  02/10/17 153 lb (69.4 kg)  12/16/16 145 lb (65.8 kg)  11/08/16 147 lb (66.7 kg)    Physical Exam  Constitutional: She is oriented to person, place, and time. She appears  well-developed.  HENT:  Head: Normocephalic and atraumatic.  Eyes: EOM are normal.  Neck: Normal range of motion. Neck supple. No tracheal deviation present. No thyromegaly present.  Cardiovascular: Normal rate and regular rhythm.   Pulmonary/Chest: Effort normal and breath sounds normal.  Abdominal: Soft. Bowel sounds are normal. There is no tenderness. There is no guarding.  Musculoskeletal: Normal range of motion. She exhibits no edema.  Neurological: She is alert and oriented to person, place, and time. She has normal reflexes. No cranial nerve deficit. Coordination normal.  Skin: Skin is warm and dry. No rash noted. No erythema. No pallor.  Psychiatric: She has a normal mood and affect. Judgment normal.    Results for orders placed or performed in visit on 37/10/62  Basic metabolic panel  Result Value Ref Range   BUN 17 4 - 21   Creatinine 0.7 0.5 - 1.1      Assessment & Plan:   1. Uncontrolled type 2 diabetes mellitus with complication, with long-term current use of insulin ([place])  - patient remains at a high risk for more acute and chronic complications  of diabetes which include CAD, CVA, CKD, retinopathy, and neuropathy. These are all discussed in detail with the patient.  Patient came with better control of glycemia , still on  chemotherapy involving some steroids related to her recent diagnosis of CLL.    - Her recent A1c is not reported, her average blood glucose is  174 for the last 30 days, verbally reporting that her A1c was improved to 7.1% from 7.8%, generally improved from 11.2%.  Glucose logs and insulin administration records pertaining to this visit,  to be scanned into patient's records.  Recent labs reviewed.   - I have re-counseled the patient on diet management  by adopting a carbohydrate restricted / protein rich  Diet.  -Suggestion is made for her to avoid simple carbohydrates  from her diet including Cakes, Sweet Desserts, Ice Cream, Soda (diet and  regular), Sweet Tea, Candies, Chips, Cookies, Store Bought Juices, Alcohol in Excess of  1-2 drinks a day, Artificial Sweeteners, and "Sugar-free" Products. This will help patient to have stable blood glucose profile and potentially avoid unintended weight gain.   - Patient is advised to stick to a routine mealtimes to eat 3 meals  a day and avoid unnecessary snacks ( to snack only to correct hypoglycemia).   - I have approached patient with the following individualized plan to manage diabetes and patient agrees.  - She has responded to recent adjustment in her basal insulin. I will continue basal insulin Toujeo at 40  units daily at bedtime, lower her Humalog to 10 units 3 times a day before meals for pre-meal glucose above 90 mg/dL,  associated with strict monitoring of glucose   4 times a day-before meals and at bedtime.  -Patient is encouraged to call clinic for blood glucose levels less than 70 or above 200 mg /dl x 3 readings in a row.  -  She will stay off of metformin for now.   - She is not a good candidate for incretin therapy, SGLT2 inhibitors . - Patient specific target  for A1c; LDL, HDL, Triglycerides, and  Waist Circumference were discussed in detail.  2) BP/HTN: Controlled. Continue current medications including ACEI/ARB. 3) Lipids/HPL:  continue statins. 4)  Weight/Diet: CDE consult in progress, exercise, and carbohydrates information provided.  5) Chronic Care/Health Maintenance:  -Patient is on ACEI/ARB and Statin medications and encouraged to continue to follow up with Ophthalmology, Podiatrist at least yearly or according to recommendations, and advised to  stay away from smoking. I have recommended yearly flu vaccine and pneumonia vaccination at least every 5 years; moderate intensity exercise for up to 150 minutes weekly; and  sleep for at least 7 hours a day.  - Time spent with the patient: 25 min, of which >50% was spent in reviewing her sugar logs , discussing her  hypo- and hyper-glycemic episodes, reviewing her current and  previous labs and insulin doses and developing a plan to avoid hypo- and hyper-glycemia.    - I advised patient to maintain close follow up with their PCP for primary care needs.  Patient is asked to bring meter and  blood glucose logs during her next visit.   Follow up plan: -Return in about 3 months (around 05/13/2017) for meter, and logs.  Glade Lloyd, MD Phone: [phone]  Fax: [phone]  This note was partially dictated with voice recognition software. Similar sounding words can be transcribed inadequately or may not  be corrected upon review.  02/10/2017, 11:55 AM

## 2017-02-23 ENCOUNTER — Other Ambulatory Visit: Payer: Self-pay | Admitting: "Endocrinology

## 2017-03-18 ENCOUNTER — Encounter (INDEPENDENT_AMBULATORY_CARE_PROVIDER_SITE_OTHER): Payer: BLUE CROSS/BLUE SHIELD | Admitting: Ophthalmology

## 2017-03-18 DIAGNOSIS — H35033 Hypertensive retinopathy, bilateral: Secondary | ICD-10-CM | POA: Diagnosis not present

## 2017-03-18 DIAGNOSIS — E11319 Type 2 diabetes mellitus with unspecified diabetic retinopathy without macular edema: Secondary | ICD-10-CM | POA: Diagnosis not present

## 2017-03-18 DIAGNOSIS — I10 Essential (primary) hypertension: Secondary | ICD-10-CM | POA: Diagnosis not present

## 2017-03-18 DIAGNOSIS — E113393 Type 2 diabetes mellitus with moderate nonproliferative diabetic retinopathy without macular edema, bilateral: Secondary | ICD-10-CM | POA: Diagnosis not present

## 2017-03-18 DIAGNOSIS — H43813 Vitreous degeneration, bilateral: Secondary | ICD-10-CM | POA: Diagnosis not present

## 2017-03-18 DIAGNOSIS — H2513 Age-related nuclear cataract, bilateral: Secondary | ICD-10-CM

## 2017-03-20 ENCOUNTER — Ambulatory Visit (INDEPENDENT_AMBULATORY_CARE_PROVIDER_SITE_OTHER): Payer: BLUE CROSS/BLUE SHIELD | Admitting: Ophthalmology

## 2017-03-28 ENCOUNTER — Encounter (INDEPENDENT_AMBULATORY_CARE_PROVIDER_SITE_OTHER): Payer: BLUE CROSS/BLUE SHIELD | Admitting: Ophthalmology

## 2017-05-02 ENCOUNTER — Encounter: Payer: Self-pay | Admitting: Cardiovascular Disease

## 2017-05-02 ENCOUNTER — Ambulatory Visit (INDEPENDENT_AMBULATORY_CARE_PROVIDER_SITE_OTHER): Payer: BLUE CROSS/BLUE SHIELD | Admitting: Cardiovascular Disease

## 2017-05-02 VITALS — BP 172/82 | HR 90 | Ht 64.0 in | Wt 160.6 lb

## 2017-05-02 DIAGNOSIS — I10 Essential (primary) hypertension: Secondary | ICD-10-CM | POA: Diagnosis not present

## 2017-05-02 DIAGNOSIS — R0609 Other forms of dyspnea: Secondary | ICD-10-CM

## 2017-05-02 DIAGNOSIS — E782 Mixed hyperlipidemia: Secondary | ICD-10-CM | POA: Diagnosis not present

## 2017-05-02 NOTE — Assessment & Plan Note (Signed)
History of hyperlipidemia on statin therapy followed by her PCP. 

## 2017-05-02 NOTE — Progress Notes (Signed)
05/02/2017 Taylor Wilkins   02/16/1946  [id number]  Primary Physician Tillman Abide, [place] Primary Cardiologist: Lorretta Harp MD [name], [place]  HPI:  Taylor Wilkins is a 71 y.o.  married Caucasian female mother of 47, grandmother and 6 grandchildren referred by Leandrew Koyanagi FNP in [place] to be reestablished our practice. I last saw her in the office 10/25/16. She had previously seen Dr. Elisabeth Cara in our practice years ago and apparently had a normal cardiac catheterization after false positive GXT. She does work as a Teacher, early years/pre. She has cardiac risk factors notable for 2 hypertension and hyperlipidemia as well as diabetes. She does not smoke. Her mother did die of a myocardial infarction at age 22. She had new onset dyspnea on exertion over several months prior to her last office visit which prompted obtaining a 2-D echo and a Myoview stress test on 11/08/16 which were normal. Her symptoms have since resolved. She has had CLL for last 3 years and it just started chemotherapy on 12/11/16. This resulted in her beginning insulin and steroids which has apparently led to a 15 pound weight gain.   Current Meds  Medication Sig  . allopurinol (ZYLOPRIM) 300 MG tablet Take 300 mg by mouth daily.  Marland Kitchen aspirin 81 MG tablet Take 81 mg by mouth daily.  . B-D UF III MINI PEN NEEDLES 31G X 5 MM MISC USE 1 NEEDLE AS DIRECTED  . Calcium Citrate-Vitamin D (CALCIUM + D PO) Take by mouth.  . chlorambucil (LEUKERAN) 2 MG tablet Take 2 mg by mouth every 14 (fourteen) days. Give on an empty stomach 1 hour before or 2 hours after meals.  . cholecalciferol (VITAMIN D) 1000 UNITS tablet Take 1,000 Units by mouth daily.  . Cranberry 400 MG TABS Take 4,200 mg by mouth daily.  . Fish Oil-Cholecalciferol (FISH OIL + D3) 1000-1000 MG-UNIT CAPS Take by mouth.  . gabapentin (NEURONTIN) 300 MG capsule Take 300 mg by mouth daily.  Marland Kitchen glucose blood (ACCU-CHEK GUIDE) test strip Use as instructed 4 x  daily. E11.65  . Insulin Glargine (TOUJEO SOLOSTAR) 300 UNIT/ML SOPN Inject 40 Units into the skin at bedtime.  . Insulin Glargine 300 UNIT/ML SOPN Inject 40 Units into the skin at bedtime.  . insulin lispro (HUMALOG KWIKPEN) 100 UNIT/ML KiwkPen Inject 0.15-0.21 mLs (15-21 Units total) into the skin 3 (three) times daily before meals.  . Insulin Pen Needle (B-D ULTRAFINE III SHORT PEN) 31G X 8 MM MISC 1 each by Does not apply route as directed.  . Insulin Pen Needle (BD PEN NEEDLE NANO U/F) 32G X 4 MM MISC 1 each by Does not apply route 4 (four) times daily.  . IRON PO Take 26 mg by mouth 2 (two) times daily.  Marland Kitchen pyridoxine (B-6) 100 MG tablet Take 100 mg by mouth daily.  . rosuvastatin (CRESTOR) 10 MG tablet Take 10 mg by mouth daily.  . verapamil (COVERA HS) 240 MG (CO) 24 hr tablet Take 240 mg by mouth at bedtime.  . vitamin B-12 (CYANOCOBALAMIN) 1000 MCG tablet Take 1,000 mcg by mouth daily.     No Known Allergies  Social History   Socioeconomic History  . Marital status: Married    Spouse name: Not on file  . Number of children: Not on file  . Years of education: Not on file  . Highest education level: Not on file  Social Needs  . Financial resource strain: Not on file  .  Food insecurity - worry: Not on file  . Food insecurity - inability: Not on file  . Transportation needs - medical: Not on file  . Transportation needs - non-medical: Not on file  Occupational History  . Not on file  Tobacco Use  . Smoking status: Never Smoker  . Smokeless tobacco: Never Used  Substance and Sexual Activity  . Alcohol use: Not on file  . Drug use: Not on file  . Sexual activity: Not on file  Other Topics Concern  . Not on file  Social History Narrative  . Not on file     Review of Systems: General: negative for chills, fever, night sweats or weight changes.  Cardiovascular: negative for chest pain, dyspnea on exertion, edema, orthopnea, palpitations, paroxysmal nocturnal dyspnea or  shortness of breath Dermatological: negative for rash Respiratory: negative for cough or wheezing Urologic: negative for hematuria Abdominal: negative for nausea, vomiting, diarrhea, bright red blood per rectum, melena, or hematemesis Neurologic: negative for visual changes, syncope, or dizziness All other systems reviewed and are otherwise negative except as noted above.    Blood pressure (!) 172/82, pulse 90, height 5\' 4"  (1.626 m), weight 160 lb 9.6 oz (72.8 kg).  General appearance: alert and no distress Neck: no adenopathy, no carotid bruit, no JVD, supple, symmetrical, trachea midline and thyroid not enlarged, symmetric, no tenderness/mass/nodules Lungs: clear to auscultation bilaterally Heart: regular rate and rhythm, S1, S2 normal, no murmur, click, rub or gallop Extremities: extremities normal, atraumatic, no cyanosis or edema Pulses: 2+ and symmetric Skin: Skin color, texture, turgor normal. No rashes or lesions Neurologic: Alert and oriented X 3, normal strength and tone. Normal symmetric reflexes. Normal coordination and gait  EKG sinus rhythm at 90 without ST or T-wave changes. I personally reviewed this EKG.  ASSESSMENT AND PLAN:   Mixed hyperlipidemia History of hyperlipidemia on statin therapy followed by her PCP  Essential hypertension, benign History of essential hypertension of her verapamil blood pressure measured at 172/82. This reading is significant only higher than her blood pressure usually runs at home when she checks it which is usually in the 1:30 to 140 range. She has gained 15 pounds over the last 4 months which she attributes to steroids and insulin which are new for her as a result of chemotherapy for her CLL.  Dyspnea on exertion History of dyspnea on exertion which has improved. She did have a 2-D echo and a Myoview stress test 11/08/16 which were normal.      Lorretta Harp MD [facility], [facility] 05/02/2017 8:47 AM

## 2017-05-02 NOTE — Assessment & Plan Note (Signed)
History of dyspnea on exertion which has improved. She did have a 2-D echo and a Myoview stress test 11/08/16 which were normal.

## 2017-05-02 NOTE — Patient Instructions (Signed)

## 2017-05-02 NOTE — Assessment & Plan Note (Signed)
History of essential hypertension of her verapamil blood pressure measured at 172/82. This reading is significant only higher than her blood pressure usually runs at home when she checks it which is usually in the 1:30 to 140 range. She has gained 15 pounds over the last 4 months which she attributes to steroids and insulin which are new for her as a result of chemotherapy for her CLL.

## 2017-05-07 LAB — HEMOGLOBIN A1C: HEMOGLOBIN A1C: 7.5

## 2017-05-07 LAB — BASIC METABOLIC PANEL
BUN: 17 (ref 4–21)
Creatinine: 0.8 (ref <=1.1)

## 2017-05-14 ENCOUNTER — Encounter: Payer: Self-pay | Admitting: "Endocrinology

## 2017-05-14 ENCOUNTER — Ambulatory Visit (INDEPENDENT_AMBULATORY_CARE_PROVIDER_SITE_OTHER): Payer: BLUE CROSS/BLUE SHIELD | Admitting: "Endocrinology

## 2017-05-14 VITALS — BP 139/86 | HR 84 | Ht 64.0 in | Wt 164.0 lb

## 2017-05-14 DIAGNOSIS — E1165 Type 2 diabetes mellitus with hyperglycemia: Secondary | ICD-10-CM | POA: Diagnosis not present

## 2017-05-14 DIAGNOSIS — I10 Essential (primary) hypertension: Secondary | ICD-10-CM | POA: Diagnosis not present

## 2017-05-14 DIAGNOSIS — E782 Mixed hyperlipidemia: Secondary | ICD-10-CM | POA: Diagnosis not present

## 2017-05-14 DIAGNOSIS — E118 Type 2 diabetes mellitus with unspecified complications: Secondary | ICD-10-CM | POA: Diagnosis not present

## 2017-05-14 DIAGNOSIS — IMO0002 Reserved for concepts with insufficient information to code with codable children: Secondary | ICD-10-CM

## 2017-05-14 DIAGNOSIS — Z794 Long term (current) use of insulin: Secondary | ICD-10-CM | POA: Diagnosis not present

## 2017-05-14 NOTE — Progress Notes (Signed)
Subjective:    Patient ID: Taylor Wilkins, female    DOB: 12/02/1945, PCP Tillman Abide, FNP   Past Medical History:  Diagnosis Date  . Cancer ([place])    Basal cell on face  . Chest pain 08/2008   normal coronary angiogram  . Diabetes ([place])   . Hyperlipemia   . Hypertension   . 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 Sleep apnea    Past Surgical History:  Procedure Laterality Date  . ABDOMINAL HYSTERECTOMY    . ANKLE SURGERY  2006  . TONSILLECTOMY  1957   Social History   Socioeconomic History  . Marital status: Married    Spouse name: None  . Number of children: None  . Years of education: None  . Highest education level: None  Social Needs  . Financial resource strain: None  . Food insecurity - worry: None  . Food insecurity - inability: None  . Transportation needs - medical: None  . Transportation needs - non-medical: None  Occupational History  . None  Tobacco Use  . Smoking status: Never Smoker  . Smokeless tobacco: Never Used  Substance and Sexual Activity  . Alcohol use: None  . Drug use: None  . Sexual activity: None  Other Topics Concern  . None  Social History Narrative  . None   Outpatient Encounter Medications as of 05/14/2017  Medication Sig  . pravastatin (PRAVACHOL) 10 MG tablet Take 10 mg by mouth daily.  Marland Kitchen aspirin 81 MG tablet Take 81 mg by mouth daily.  . B-D UF III MINI PEN NEEDLES 31G X 5 MM MISC USE 1 NEEDLE AS DIRECTED  . Calcium Citrate-Vitamin D (CALCIUM + D PO) Take by mouth.  . chlorambucil (LEUKERAN) 2 MG tablet Take 2 mg by mouth every 14 (fourteen) days. Give on an empty stomach 1 hour before or 2 hours after meals.  . cholecalciferol (VITAMIN D) 1000 UNITS tablet Take 1,000 Units by mouth daily.  . Cranberry 400 MG TABS Take 4,200 mg by mouth daily.  . Fish Oil-Cholecalciferol (FISH OIL + D3) 1000-1000 MG-UNIT CAPS Take by mouth.  . gabapentin (NEURONTIN) 300 MG capsule Take 300 mg by mouth daily.  Marland Kitchen glucose blood (ACCU-CHEK  GUIDE) test strip Use as instructed 4 x daily. E11.65  . Insulin Glargine (TOUJEO SOLOSTAR) 300 UNIT/ML SOPN Inject 40 Units into the skin at bedtime.  . Insulin Glargine 300 UNIT/ML SOPN Inject 40 Units into the skin at bedtime.  . insulin lispro (HUMALOG KWIKPEN) 100 UNIT/ML KiwkPen Inject 0.15-0.21 mLs (15-21 Units total) into the skin 3 (three) times daily before meals.  . Insulin Pen Needle (BD PEN NEEDLE NANO U/F) 32G X 4 MM MISC 1 each by Does not apply route 4 (four) times daily.  . IRON PO Take 26 mg by mouth 2 (two) times daily.  Marland Kitchen pyridoxine (B-6) 100 MG tablet Take 100 mg by mouth daily.  . verapamil (COVERA HS) 240 MG (CO) 24 hr tablet Take 240 mg by mouth at bedtime.  . vitamin B-12 (CYANOCOBALAMIN) 1000 MCG tablet Take 1,000 mcg by mouth daily.  . [DISCONTINUED] allopurinol (ZYLOPRIM) 300 MG tablet Take 300 mg by mouth daily.  . [DISCONTINUED] Insulin Pen Needle (B-D ULTRAFINE III SHORT PEN) 31G X 8 MM MISC 1 each by Does not apply route as directed.  . [DISCONTINUED] rosuvastatin (CRESTOR) 10 MG tablet Take 10 mg by mouth daily.   No facility-administered encounter medications on file as of 05/14/2017.  ALLERGIES: No Known Allergies VACCINATION STATUS:  There is no immunization history on file for this patient.  Diabetes  She presents for her follow-up diabetic visit. She has type 2 diabetes mellitus. Onset time: He was diagnosed at approximate age of 64 years. Her disease course has been stable. There are no hypoglycemic associated symptoms. Pertinent negatives for hypoglycemia include no confusion, headaches, pallor or seizures. There are no diabetic associated symptoms. Pertinent negatives for diabetes include no chest pain, no polydipsia, no polyphagia and no polyuria. There are no hypoglycemic complications. 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 She was found to have severe hyperglycemia in to 400s, this  necessitated increasing her Toujeo to 50 units daily. ). Diabetic complications include retinopathy. Risk factors for coronary artery disease include diabetes mellitus, dyslipidemia, hypertension and tobacco exposure. Current diabetic treatment includes insulin injections and oral agent (dual therapy). Her weight is increasing steadily. She is following a generally unhealthy diet. She has had a previous visit with a dietitian. She rarely participates in exercise. Her breakfast blood glucose range is generally 140-180 mg/dl. Her lunch blood glucose range is generally 130-140 mg/dl. Her dinner blood glucose range is generally 130-140 mg/dl. Her bedtime blood glucose range is generally 130-140 mg/dl. Her overall blood glucose range is 140-180 mg/dl. An ACE inhibitor/angiotensin II receptor blocker is being taken.  Hyperlipidemia  This is a chronic problem. The current episode started more than 1 year ago. Pertinent negatives include no chest pain, myalgias or shortness of breath. Current antihyperlipidemic treatment includes statins.  Hypertension  This is a chronic problem. The current episode started more than 1 year ago. Pertinent negatives include no chest pain, headaches, palpitations or shortness of breath. Risk factors for coronary artery disease include diabetes mellitus and dyslipidemia. Hypertensive end-organ damage includes retinopathy.    Review of Systems  Constitutional: Negative for unexpected weight change.  HENT: Negative for trouble swallowing and voice change.   Eyes: Negative for visual disturbance.  Respiratory: Negative for cough, shortness of breath and wheezing.   Cardiovascular: Negative for chest pain, palpitations and leg swelling.  Gastrointestinal: Negative for diarrhea, nausea and vomiting.  Endocrine: Negative for cold intolerance, heat intolerance, polydipsia, polyphagia and polyuria.  Musculoskeletal: Negative for arthralgias and myalgias.  Skin: Negative for color  change, pallor, rash and wound.  Neurological: Negative for seizures and headaches.  Psychiatric/Behavioral: Negative for confusion and suicidal ideas.    Objective:    BP 139/86   Pulse 84   Ht 5\' 4"  (1.626 m)   Wt 164 lb (74.4 kg)   BMI 28.15 kg/m   Wt Readings from Last 3 Encounters:  05/14/17 164 lb (74.4 kg)  05/02/17 160 lb 9.6 oz (72.8 kg)  02/10/17 153 lb (69.4 kg)    Physical Exam  Constitutional: She is oriented to person, place, and time. She appears well-developed.  HENT:  Head: Normocephalic and atraumatic.  Eyes: EOM are normal.  Neck: Normal range of motion. Neck supple. No tracheal deviation present. No thyromegaly present.  Cardiovascular: Normal rate and regular rhythm.  Pulmonary/Chest: Effort normal and breath sounds normal.  Abdominal: Soft. Bowel sounds are normal. There is no tenderness. There is no guarding.  Musculoskeletal: Normal range of motion. She exhibits no edema.  Neurological: She is alert and oriented to person, place, and time. She has normal reflexes. No cranial nerve deficit. Coordination normal.  Skin: Skin is warm and dry. No rash noted. No erythema. No pallor.  Psychiatric:  She has a normal mood and affect. Judgment normal.    Results for orders placed or performed in visit on 38/18/29  Basic metabolic panel  Result Value Ref Range   BUN 17 4 - 21   Creatinine 0.8 0.5 - 1.1  Hemoglobin A1c  Result Value Ref Range   Hemoglobin A1C 7.5      Assessment & Plan:   1. Uncontrolled type 2 diabetes mellitus with complication, with long-term current use of insulin ([place])  - patient remains at a high risk for more acute and chronic complications of diabetes which include CAD, CVA, CKD, retinopathy, and neuropathy. These are all discussed in detail with the patient.  Patient came with  stable glycemic profile, recently finishing her chemotherapy which involved steroids related to her recent diagnosis of CLL.    - Her A1c has stabilized  at 7.5%, generally improved from 11.2%.  Glucose logs and insulin administration records pertaining to this visit,  to be scanned into patient's records.  Recent labs reviewed.   - I have re-counseled the patient on diet management  by adopting a carbohydrate restricted / protein rich  Diet. -  Suggestion is made for her to avoid simple carbohydrates  from her diet including Cakes, Sweet Desserts / Pastries, Ice Cream, Soda (diet and regular), Sweet Tea, Candies, Chips, Cookies, Store Bought Juices, Alcohol in Excess of  1-2 drinks a day, Artificial Sweeteners, and "Sugar-free" Products. This will help patient to have stable blood glucose profile and potentially avoid unintended weight gain.   - Patient is advised to stick to a routine mealtimes to eat 3 meals  a day and avoid unnecessary snacks ( to snack only to correct hypoglycemia).   - I have approached patient with the following individualized plan to manage diabetes and patient agrees.  - She has responded to recent adjustment in her basal insulin. I advised her to continue Toujeo  40  units daily at bedtime, and continue Humalog 10 units 3 times a day before meals for pre-meal glucose above 90 mg/dL,  associated with strict monitoring of glucose   4 times a day-before meals and at bedtime.  -Patient is encouraged to call clinic for blood glucose levels less than 70 or above 200 mg /dl x 3 readings in a row.  -  She will stay off of metformin for now.   - She is not a good candidate for incretin therapy, SGLT2 inhibitors . - Patient specific target  for A1c; LDL, HDL, Triglycerides, and  Waist Circumference were discussed in detail.  2) BP/HTN: Controlled. I advised her to continue her current blood pressure medications including  ACEI/ARB. 3) Lipids/HPL:  Continue statins. Controlled LDL at 67.   4)  Weight/Diet: CDE consult in progress, exercise, and carbohydrates information provided.  5) hypomagnesemia: I advised her to resume her  magnesium oxide 400 mg by mouth twice a day until next blood work.   6) Chronic Care/Health Maintenance:  -Patient is on ACEI/ARB and Statin medications and encouraged to continue to follow up with Ophthalmology, Podiatrist at least yearly or according to recommendations, and advised to  stay away from smoking. I have recommended yearly flu vaccine and pneumonia vaccination at least every 5 years; moderate intensity exercise for up to 150 minutes weekly; and  sleep for at least 7 hours a day.  - Time spent with the patient: 25 min, of which >50% was spent in reviewing her blood glucose logs , discussing her hypo- and hyper-glycemic episodes,  reviewing her current and  previous labs and insulin doses and developing a plan to avoid hypo- and hyper-glycemia. Please refer to Patient Instructions for Blood Glucose Monitoring and Insulin/Medications Dosing Guide"  in media tab for additional information.  - I advised patient to maintain close follow up with her PCP for primary care needs.  Follow up plan: -Return in about 3 months (around 08/12/2017) for follow up with pre-visit labs, meter, and logs.  Glade Lloyd, MD Phone: [phone]  Fax: [phone]  This note was partially dictated with voice recognition software. Similar sounding words can be transcribed inadequately or may not  be corrected upon review.  05/14/2017, 12:08 PM

## 2017-05-20 ENCOUNTER — Other Ambulatory Visit: Payer: Self-pay | Admitting: "Endocrinology

## 2017-06-13 ENCOUNTER — Telehealth: Payer: Self-pay | Admitting: "Endocrinology

## 2017-06-13 ENCOUNTER — Other Ambulatory Visit: Payer: Self-pay | Admitting: "Endocrinology

## 2017-06-13 NOTE — Telephone Encounter (Signed)
Taylor Wilkins is asking to please refill her test stripes she is completely out, please advise?

## 2017-06-13 NOTE — Telephone Encounter (Signed)
Rx sent 

## 2017-08-06 LAB — BASIC METABOLIC PANEL
BUN: 18 (ref 4–21)
Creatinine: 0.8 (ref <=1.1)

## 2017-08-06 LAB — HEMOGLOBIN A1C: Hemoglobin A1C: 6.9

## 2017-08-13 ENCOUNTER — Encounter: Payer: Self-pay | Admitting: "Endocrinology

## 2017-08-13 ENCOUNTER — Ambulatory Visit (INDEPENDENT_AMBULATORY_CARE_PROVIDER_SITE_OTHER): Payer: BLUE CROSS/BLUE SHIELD | Admitting: "Endocrinology

## 2017-08-13 VITALS — BP 133/81 | HR 86 | Ht 64.0 in | Wt 178.0 lb

## 2017-08-13 DIAGNOSIS — E118 Type 2 diabetes mellitus with unspecified complications: Secondary | ICD-10-CM | POA: Diagnosis not present

## 2017-08-13 DIAGNOSIS — E6609 Other obesity due to excess calories: Secondary | ICD-10-CM

## 2017-08-13 DIAGNOSIS — Z6830 Body mass index (BMI) 30.0-30.9, adult: Secondary | ICD-10-CM

## 2017-08-13 DIAGNOSIS — E782 Mixed hyperlipidemia: Secondary | ICD-10-CM | POA: Diagnosis not present

## 2017-08-13 DIAGNOSIS — E1165 Type 2 diabetes mellitus with hyperglycemia: Secondary | ICD-10-CM

## 2017-08-13 DIAGNOSIS — I10 Essential (primary) hypertension: Secondary | ICD-10-CM | POA: Diagnosis not present

## 2017-08-13 DIAGNOSIS — Z794 Long term (current) use of insulin: Secondary | ICD-10-CM

## 2017-08-13 DIAGNOSIS — IMO0002 Reserved for concepts with insufficient information to code with codable children: Secondary | ICD-10-CM

## 2017-08-13 LAB — TSH: TSH: 2.59 mIU/L (ref 0.40–4.50)

## 2017-08-13 LAB — T4, FREE: FREE T4: 1.1 ng/dL (ref 0.8–1.8)

## 2017-08-13 MED ORDER — INSULIN LISPRO 100 UNIT/ML (KWIKPEN): 10.0000 [IU] | PEN_INJECTOR | Freq: Three times a day (TID) | SUBCUTANEOUS | 2 refills | Status: DC

## 2017-08-13 MED ORDER — FREESTYLE LIBRE 14 DAY SENSOR MISC: 1.0000 | 2 refills | Status: DC

## 2017-08-13 MED ORDER — FREESTYLE LIBRE 14 DAY READER DEVI: 1.0000 | Freq: Once | 0 refills | Status: AC

## 2017-08-13 NOTE — Patient Instructions (Signed)

## 2017-08-13 NOTE — Progress Notes (Signed)
Subjective:    Patient ID: Taylor Wilkins, female    DOB: June 25, 1945, PCP Tillman Abide, FNP   Past Medical History:  Diagnosis Date  . Cancer ([place])    Basal cell on face  . Chest pain 08/2008   normal coronary angiogram  . Diabetes ([place])   . Hyperlipemia   . Hypertension   . 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 Sleep apnea    Past Surgical History:  Procedure Laterality Date  . ABDOMINAL HYSTERECTOMY    . ANKLE SURGERY  2006  . TONSILLECTOMY  1957   Social History   Socioeconomic History  . Marital status: Married    Spouse name: Not on file  . Number of children: Not on file  . Years of education: Not on file  . Highest education level: Not on file  Occupational History  . Not on file  Social Needs  . Financial resource strain: Not on file  . Food insecurity:    Worry: Not on file    Inability: Not on file  . Transportation needs:    Medical: Not on file    Non-medical: Not on file  Tobacco Use  . Smoking status: Never Smoker  . Smokeless tobacco: Never Used  Substance and Sexual Activity  . Alcohol use: Never    Frequency: Never  . Drug use: Never  . Sexual activity: Not on file  Lifestyle  . Physical activity:    Days per week: Not on file    Minutes per session: Not on file  . Stress: Not on file  Relationships  . Social connections:    Talks on phone: Not on file    Gets together: Not on file    Attends religious service: Not on file    Active member of club or organization: Not on file    Attends meetings of clubs or organizations: Not on file    Relationship status: Not on file  Other Topics Concern  . Not on file  Social History Narrative  . Not on file   Outpatient Encounter Medications as of 08/13/2017  Medication Sig  . ACCU-CHEK GUIDE test strip USE TO TEST BLOOD SUGAR FOUR TIMES A DAY  . aspirin 81 MG tablet Take 81 mg by mouth daily.  . B-D UF III MINI PEN NEEDLES 31G X 5 MM MISC USE 1 NEEDLE AS DIRECTED  . Calcium  Citrate-Vitamin D (CALCIUM + D PO) Take by mouth.  . chlorambucil (LEUKERAN) 2 MG tablet Take 2 mg by mouth every 14 (fourteen) days. Give on an empty stomach 1 hour before or 2 hours after meals.  . cholecalciferol (VITAMIN D) 1000 UNITS tablet Take 1,000 Units by mouth daily.  . Continuous Blood Gluc Receiver (FREESTYLE LIBRE 14 DAY READER) DEVI 1 each by Does not apply route once for 1 dose.  . Continuous Blood Gluc Sensor (FREESTYLE LIBRE 14 DAY SENSOR) MISC Inject 1 each into the skin every 14 (fourteen) days. Use as directed.  . Cranberry 400 MG TABS Take 4,200 mg by mouth daily.  . Fish Oil-Cholecalciferol (FISH OIL + D3) 1000-1000 MG-UNIT CAPS Take by mouth.  . gabapentin (NEURONTIN) 300 MG capsule Take 300 mg by mouth daily.  . Insulin Glargine 300 UNIT/ML SOPN Inject 40 Units into the skin at bedtime.  . insulin lispro (HUMALOG KWIKPEN) 100 UNIT/ML KiwkPen Inject 0.1-0.16 mLs (10-16 Units total) into the skin 3 (three) times daily before meals.  . Insulin Pen Needle (  BD PEN NEEDLE NANO U/F) 32G X 4 MM MISC 1 each by Does not apply route 4 (four) times daily.  . IRON PO Take 26 mg by mouth 2 (two) times daily.  . pravastatin (PRAVACHOL) 10 MG tablet Take 10 mg by mouth daily.  Marland Kitchen pyridoxine (B-6) 100 MG tablet Take 100 mg by mouth daily.  . verapamil (COVERA HS) 240 MG (CO) 24 hr tablet Take 240 mg by mouth at bedtime.  . vitamin B-12 (CYANOCOBALAMIN) 1000 MCG tablet Take 1,000 mcg by mouth daily.  . [DISCONTINUED] insulin lispro (HUMALOG KWIKPEN) 100 UNIT/ML KiwkPen Inject 0.15-0.21 mLs (15-21 Units total) into the skin 3 (three) times daily before meals.  . [DISCONTINUED] TOUJEO SOLOSTAR 300 UNIT/ML SOPN INJECT 40 UNITS INTO THE SKIN AT BEDTIME   No facility-administered encounter medications on file as of 08/13/2017.    ALLERGIES: No Known Allergies VACCINATION STATUS:  There is no immunization history on file for this patient.  Diabetes  She presents for her follow-up diabetic  visit. She has type 2 diabetes mellitus. Onset time: He was diagnosed at approximate age of 45 years. Her disease course has been improving. There are no hypoglycemic associated symptoms. Pertinent negatives for hypoglycemia include no confusion, headaches, pallor or seizures. There are no diabetic associated symptoms. Pertinent negatives for diabetes include no chest pain, no polydipsia, no polyphagia and no polyuria. There are no hypoglycemic complications. 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 She was found to have severe hyperglycemia in to 400s, this necessitated increasing her Toujeo to 50 units daily. ). Diabetic complications include retinopathy. Risk factors for coronary artery disease include diabetes mellitus, dyslipidemia, hypertension and tobacco exposure. Current diabetic treatment includes insulin injections and oral agent (dual therapy). Her weight is increasing steadily. She is following a generally unhealthy diet. She has had a previous visit with a dietitian. She rarely participates in exercise. Her breakfast blood glucose range is generally 130-140 mg/dl. Her lunch blood glucose range is generally 130-140 mg/dl. Her dinner blood glucose range is generally 130-140 mg/dl. Her bedtime blood glucose range is generally 130-140 mg/dl. Her overall blood glucose range is 130-140 mg/dl. An ACE inhibitor/angiotensin II receptor blocker is being taken. Eye exam is current.  Hyperlipidemia  This is a chronic problem. The current episode started more than 1 year ago. The problem is controlled. Pertinent negatives include no chest pain, myalgias or shortness of breath. Current antihyperlipidemic treatment includes statins. Risk factors for coronary artery disease include diabetes mellitus, obesity, a sedentary lifestyle and post-menopausal.  Hypertension  This is a chronic problem. The current episode started more than 1 year ago. The  problem is controlled. Pertinent negatives include no chest pain, headaches, palpitations or shortness of breath. Risk factors for coronary artery disease include diabetes mellitus and dyslipidemia. Past treatments include calcium channel blockers. Hypertensive end-organ damage includes retinopathy.    Review of Systems  Constitutional: Negative for unexpected weight change.  HENT: Negative for trouble swallowing and voice change.   Eyes: Negative for visual disturbance.  Respiratory: Negative for cough, shortness of breath and wheezing.   Cardiovascular: Negative for chest pain, palpitations and leg swelling.  Gastrointestinal: Negative for diarrhea, nausea and vomiting.  Endocrine: Negative for cold intolerance, heat intolerance, polydipsia, polyphagia and polyuria.  Musculoskeletal: Negative for arthralgias and myalgias.  Skin: Negative for color change, pallor, rash and wound.  Neurological: Negative for seizures and headaches.  Psychiatric/Behavioral: Negative for confusion and suicidal ideas.  Objective:    BP 133/81   Pulse 86   Ht 5\' 4"  (1.626 m)   Wt 178 lb (80.7 kg)   BMI 30.55 kg/m   Wt Readings from Last 3 Encounters:  08/13/17 178 lb (80.7 kg)  05/14/17 164 lb (74.4 kg)  05/02/17 160 lb 9.6 oz (72.8 kg)    Physical Exam  Constitutional: She is oriented to person, place, and time. She appears well-developed.  HENT:  Head: Normocephalic and atraumatic.  Eyes: EOM are normal.  Neck: Normal range of motion. Neck supple. No tracheal deviation present. No thyromegaly present.  Cardiovascular: Normal rate.  Pulmonary/Chest: Effort normal.  Abdominal: Soft. There is no tenderness. There is no guarding.  Musculoskeletal: Normal range of motion. She exhibits no edema.  Neurological: She is alert and oriented to person, place, and time. She has normal reflexes. No cranial nerve deficit. Coordination normal.  Skin: Skin is warm and dry. No rash noted. No erythema. No  pallor.  Psychiatric: She has a normal mood and affect. Judgment normal.    Results for orders placed or performed in visit on 33/29/51  Basic metabolic panel  Result Value Ref Range   BUN 18 4 - 21   Creatinine 0.8 0.5 - 1.1  Hemoglobin A1c  Result Value Ref Range   Hemoglobin A1C 6.9    Lipid Panel     Component Value Date/Time   CHOL 128 01/24/2016   TRIG 88 01/24/2016   HDL 43 01/24/2016   LDLCALC 67 01/24/2016    Assessment & Plan:   1. Uncontrolled type 2 diabetes mellitus with complication, with long-term current use of insulin ([place])  -She returns with improved glycemic profile and A1c of 6.9%.  This is improving generally from 11.2%.   Patient remains at a high risk for more acute and chronic complications of diabetes which include CAD, CVA, CKD, retinopathy, and neuropathy. These are all discussed in detail with the patient.  Patient came with  stable glycemic profile, recently finishing her chemotherapy which involved steroids related to her recent diagnosis of CLL.  Glucose logs and insulin administration records pertaining to this visit,  to be scanned into patient's records.  Recent labs reviewed.   - I have re-counseled the patient on diet management  by adopting a carbohydrate restricted / protein rich  Diet.  -  Suggestion is made for her to avoid simple carbohydrates  from her diet including Cakes, Sweet Desserts / Pastries, Ice Cream, Soda (diet and regular), Sweet Tea, Candies, Chips, Cookies, Store Bought Juices, Alcohol in Excess of  1-2 drinks a day, Artificial Sweeteners, and "Sugar-free" Products. This will help patient to have stable blood glucose profile and potentially avoid unintended weight gain.  - Patient is advised to stick to a routine mealtimes to eat 3 meals  a day and avoid unnecessary snacks ( to snack only to correct hypoglycemia).   - I have approached patient with the following individualized plan to manage diabetes and patient  agrees.  - She has responded to recent adjustment in her basal insulin. I advised her to continue Toujeo  40  units daily at bedtime, and continue Humalog 10 units 3 times a day before meals for pre-meal glucose above 90 mg/dL,  associated with strict monitoring of glucose   4 times a day-before meals and at bedtime.  -She will benefit from continued glucose monitoring.  I discussed and initiated a prescription for the [facility] device for her.  -Patient is encouraged to  call clinic for blood glucose levels less than 70 or above 200 mg /dl x 3 readings in a row.  -  She will stay off of metformin for now.   - She is not a good candidate for incretin therapy, SGLT2 inhibitors . - Patient specific target  for A1c; LDL, HDL, Triglycerides, and  Waist Circumference were discussed in detail.  2) BP/HTN: Her blood pressure is controlled to target.   I advised her to continue her current blood pressure medications including  ACEI/ARB. 3) Lipids/HPL: Her lipid panel is controlled with LDL of 67.  She is advised to continue pravastatin 10 mg p.o. nightly.   4)  Weight/Diet: CDE consult in progress, exercise, and carbohydrates information provided. -Given her significant weight gain recently, I offered her thyroid function tests. 5) hypomagnesemia: Her magnesium level is corrected at 2.2.  She is status post magnesium supplement.  She is not on magnesium supplement anymore.  6) Chronic Care/Health Maintenance:  -Patient is on ACEI/ARB and Statin medications and encouraged to continue to follow up with Ophthalmology, Podiatrist at least yearly or according to recommendations, and advised to  stay away from smoking. I have recommended yearly flu vaccine and pneumonia vaccination at least every 5 years; moderate intensity exercise for up to 150 minutes weekly; and  sleep for at least 7 hours a day.  - I advised patient to maintain close follow up with her PCP for primary care needs. - Time spent with  the patient: 25 min, of which >50% was spent in reviewing her blood glucose logs , discussing her hypo- and hyper-glycemic episodes, reviewing her current and  previous labs and insulin doses and developing a plan to avoid hypo- and hyper-glycemia. Please refer to Patient Instructions for Blood Glucose Monitoring and Insulin/Medications Dosing Guide"  in media tab for additional information. Taylor Wilkins participated in the discussions, expressed understanding, and voiced agreement with the above plans.  All questions were answered to her satisfaction. she is encouraged to contact clinic should she have any questions or concerns prior to her return visit.  Follow up plan: -Return in about 4 months (around 12/13/2017) for meter, and logs, follow up with pre-visit labs, meter, and logs.  Glade Lloyd, MD Phone: [phone]  Fax: [phone]  This note was partially dictated with voice recognition software. Similar sounding words can be transcribed inadequately or may not  be corrected upon review.  08/13/2017, 4:48 PM

## 2017-08-26 ENCOUNTER — Other Ambulatory Visit: Payer: Self-pay

## 2017-08-26 MED ORDER — FREESTYLE LIBRE 14 DAY READER DEVI: 1.0000 | 0 refills | Status: DC

## 2017-08-26 MED ORDER — FREESTYLE LIBRE 14 DAY SENSOR MISC: 1.0000 | 2 refills | Status: DC

## 2017-08-28 ENCOUNTER — Telehealth: Payer: Self-pay | Admitting: Nutrition

## 2017-08-28 NOTE — Telephone Encounter (Signed)
PT left vm to make an appt. Attempted to contact her but she wasn't home. Will try again later.

## 2017-09-23 ENCOUNTER — Other Ambulatory Visit: Payer: Self-pay | Admitting: "Endocrinology

## 2017-10-02 ENCOUNTER — Encounter (INDEPENDENT_AMBULATORY_CARE_PROVIDER_SITE_OTHER): Payer: BLUE CROSS/BLUE SHIELD | Admitting: Ophthalmology

## 2017-10-14 ENCOUNTER — Encounter (INDEPENDENT_AMBULATORY_CARE_PROVIDER_SITE_OTHER): Payer: BLUE CROSS/BLUE SHIELD | Admitting: Ophthalmology

## 2017-10-14 DIAGNOSIS — E11319 Type 2 diabetes mellitus with unspecified diabetic retinopathy without macular edema: Secondary | ICD-10-CM | POA: Diagnosis not present

## 2017-10-14 DIAGNOSIS — H35033 Hypertensive retinopathy, bilateral: Secondary | ICD-10-CM

## 2017-10-14 DIAGNOSIS — H43813 Vitreous degeneration, bilateral: Secondary | ICD-10-CM

## 2017-10-14 DIAGNOSIS — E113393 Type 2 diabetes mellitus with moderate nonproliferative diabetic retinopathy without macular edema, bilateral: Secondary | ICD-10-CM

## 2017-10-14 DIAGNOSIS — I10 Essential (primary) hypertension: Secondary | ICD-10-CM

## 2017-10-16 ENCOUNTER — Other Ambulatory Visit: Payer: Self-pay

## 2017-10-16 ENCOUNTER — Encounter: Payer: BLUE CROSS/BLUE SHIELD | Attending: "Endocrinology | Admitting: Nutrition

## 2017-10-16 ENCOUNTER — Encounter: Payer: Self-pay | Admitting: Nutrition

## 2017-10-16 DIAGNOSIS — Z713 Dietary counseling and surveillance: Secondary | ICD-10-CM | POA: Insufficient documentation

## 2017-10-16 DIAGNOSIS — E1165 Type 2 diabetes mellitus with hyperglycemia: Secondary | ICD-10-CM | POA: Diagnosis not present

## 2017-10-16 DIAGNOSIS — Z794 Long term (current) use of insulin: Secondary | ICD-10-CM | POA: Insufficient documentation

## 2017-10-16 DIAGNOSIS — E118 Type 2 diabetes mellitus with unspecified complications: Secondary | ICD-10-CM | POA: Diagnosis not present

## 2017-10-16 DIAGNOSIS — E669 Obesity, unspecified: Secondary | ICD-10-CM

## 2017-10-16 DIAGNOSIS — E119 Type 2 diabetes mellitus without complications: Secondary | ICD-10-CM

## 2017-10-16 MED ORDER — FREESTYLE LIBRE 14 DAY SENSOR MISC: 1.0000 | 2 refills | Status: DC

## 2017-10-16 NOTE — Patient Instructions (Addendum)
Goals 1. Follow My Plate 2.Eat 30-45 grams of carbs per meal 3. Increase low carb vegetabes-2 per meal 4. Add an egg with breakast and 2 slices of toast and fruit 5. Cut out diet sodas. Dont over correct for lower blood sugars. Treat with 15 grams of carbs and wait 15 minutes and recheck BS. Notify Dr. Dorris Fetch if having 2-3 LOW BS in a row or daily.

## 2017-10-16 NOTE — Progress Notes (Addendum)
  Medical Nutrition Therapy:  Appt start time: 0800 end time:  0900.   Assessment:  Primary concerns today: Diabetes. Lives with her spouse. S/p chemo for leukemia, finished in Dec 2018. Had lost 30 lbs with treatment. Currently is taking 40 units of Toujeo and 10 units plus sliding  scale  Humalog with meals. C/o weight gain of 30-40 lbs. She notes she panics when blood sugars gets lower and she eats a lot to bring it back up. Tends to drop BS between meals. Is on the [place] and tests at least 4-5 times per day. Walks 1-2 miles per day. Does drink diet sodas often.  Sees Dr. Dorris Fetch, Endocrinology. A1C 6.9%, down from 7.8%. . Had a 53 BS the other night before bed. Has some 60-80's between meals and she treats for low blood sugars. She admits to over correcting with food when BS gets below 100 mg/dl. Current diet is inconsistent in CHO content with meals causing fluctuations with blood sugars and weight gain. Didn't bring [place] reader with her today. Will bring next viist.  CHol readings per pt: TCHOL 126 mg/dl, TG 72 mg/dl and HDL 37 mg/dl. Lab Results  Component Value Date   HGBA1C 6.9 08/06/2017   CMP Latest Ref Rng & Units 08/06/2017 05/07/2017 02/06/2017  BUN 4 - 21 18 17 17   Creatinine 0.5 - 1.1 0.8 0.8 0.7      Preferred Learning Style:   No preference indicated   Learning Readiness:    Ready  Change in progress   MEDICATIONS:    DIETARY INTAKE:   24-hr recall:  B ( AM): 1 egg, 1 slice toast, 2 slices bacon or pancake with bacon, coffee  Snk ( AM): protein bar  L ( PM): Tukey and ham sub 6" with fruit, diet sodas Snk ( PM): fruit, nabs D ( PM): Hamburger, tomatoes, chips, Diet cola Snk ( PM): fruit Beverages: water, diet soda  Usual physical activity: walk 1-2 miles most days of week.  Estimated energy needs: 1200  calories 135 g carbohydrates 90 g protein 33 g fat  Progress Towards Goal(s):  In progress.   Nutritional Diagnosis:  NB-1.1 Food and  nutrition-related knowledge deficit As related to Diabetes.  As evidenced by A1C 6.5%..    Intervention:  Nutrition and Diabetes education provided on My Plate, CHO counting, meal planning, portion sizes, timing of meals, avoiding snacks between meals unless having a low blood sugar, target ranges for A1C and blood sugars, signs/symptoms and treatment of hyper/hypoglycemia, monitoring blood sugars, taking medications as prescribed, benefits of exercising 30 minutes per day and prevention of complications of DM.  Goals 1. Follow My Plate 2.Eat 30-45 grams of carbs per meal 3. Increase low carb vegetabes-2 per meal 4. Add an egg with breakast and 2 slices of toast and fruit 5. Cut out diet sodas. Dont over correct for lower blood sugars. Treat with 15 grams of carbs and wait 15 minutes and recheck BS. Notify Dr. Dorris Fetch if having 2-3 LOW BS in a row or daily.  Teaching Method Utilized:  Visual Auditory Hands on  Handouts given during visit include:  The Plate MEthod   Meal Plan Card*  Barriers to learning/adherence to lifestyle change: none  Demonstrated degree of understanding via:  Teach Back   Monitoring/Evaluation:  Dietary intake, exercise, meal planning, and body weight in 2 month(s).

## 2017-11-05 ENCOUNTER — Other Ambulatory Visit: Payer: Self-pay | Admitting: "Endocrinology

## 2017-12-17 ENCOUNTER — Ambulatory Visit (INDEPENDENT_AMBULATORY_CARE_PROVIDER_SITE_OTHER): Payer: BLUE CROSS/BLUE SHIELD | Admitting: "Endocrinology

## 2017-12-17 ENCOUNTER — Encounter: Payer: Self-pay | Admitting: "Endocrinology

## 2017-12-17 ENCOUNTER — Encounter: Payer: BLUE CROSS/BLUE SHIELD | Attending: Family Medicine | Admitting: Nutrition

## 2017-12-17 VITALS — Wt 184.0 lb

## 2017-12-17 VITALS — BP 135/80 | HR 84 | Ht 64.0 in | Wt 184.0 lb

## 2017-12-17 DIAGNOSIS — E119 Type 2 diabetes mellitus without complications: Secondary | ICD-10-CM | POA: Diagnosis present

## 2017-12-17 DIAGNOSIS — E782 Mixed hyperlipidemia: Secondary | ICD-10-CM

## 2017-12-17 DIAGNOSIS — Z713 Dietary counseling and surveillance: Secondary | ICD-10-CM | POA: Insufficient documentation

## 2017-12-17 DIAGNOSIS — E118 Type 2 diabetes mellitus with unspecified complications: Secondary | ICD-10-CM

## 2017-12-17 DIAGNOSIS — IMO0002 Reserved for concepts with insufficient information to code with codable children: Secondary | ICD-10-CM

## 2017-12-17 DIAGNOSIS — E1165 Type 2 diabetes mellitus with hyperglycemia: Secondary | ICD-10-CM | POA: Diagnosis not present

## 2017-12-17 DIAGNOSIS — I10 Essential (primary) hypertension: Secondary | ICD-10-CM

## 2017-12-17 DIAGNOSIS — E669 Obesity, unspecified: Secondary | ICD-10-CM

## 2017-12-17 DIAGNOSIS — Z794 Long term (current) use of insulin: Secondary | ICD-10-CM

## 2017-12-17 NOTE — Patient Instructions (Signed)

## 2017-12-17 NOTE — Progress Notes (Signed)
Endocrinology follow-up note  Subjective:    Patient ID: Taylor Wilkins, female    DOB: [birth year], PCP Tillman Abide, FNP   Past Medical History:  Diagnosis Date  . Cancer ([place])    Basal cell on face  . Chest pain 08/2008   normal coronary angiogram  . Diabetes ([place])   . Hyperlipemia   . Hypertension   . 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 Sleep apnea    Past Surgical History:  Procedure Laterality Date  . ABDOMINAL HYSTERECTOMY    . ANKLE SURGERY  2006  . TONSILLECTOMY  1957   Social History   Socioeconomic History  . Marital status: Married    Spouse name: Not on file  . Number of children: Not on file  . Years of education: Not on file  . Highest education level: Not on file  Occupational History  . Not on file  Social Needs  . Financial resource strain: Not on file  . Food insecurity:    Worry: Not on file    Inability: Not on file  . Transportation needs:    Medical: Not on file    Non-medical: Not on file  Tobacco Use  . Smoking status: Never Smoker  . Smokeless tobacco: Never Used  Substance and Sexual Activity  . Alcohol use: Never    Frequency: Never  . Drug use: Never  . Sexual activity: Not on file  Lifestyle  . Physical activity:    Days per week: Not on file    Minutes per session: Not on file  . Stress: Not on file  Relationships  . Social connections:    Talks on phone: Not on file    Gets together: Not on file    Attends religious service: Not on file    Active member of club or organization: Not on file    Attends meetings of clubs or organizations: Not on file    Relationship status: Not on file  Other Topics Concern  . Not on file  Social History Narrative  . Not on file   Outpatient Encounter Medications as of 12/17/2017  Medication Sig  . gabapentin (NEURONTIN) 300 MG capsule Take 300 mg by mouth 2 (two) times daily.  Marland Kitchen ACCU-CHEK GUIDE test strip USE TO TEST BLOOD SUGAR FOUR TIMES A DAY  . aspirin 81 MG tablet Take 81 mg  by mouth daily.  . B-D UF III MINI PEN NEEDLES 31G X 5 MM MISC USE 1 NEEDLE AS DIRECTED  . Calcium Citrate-Vitamin D (CALCIUM + D PO) Take by mouth.  . chlorambucil (LEUKERAN) 2 MG tablet Take 2 mg by mouth every 14 (fourteen) days. Give on an empty stomach 1 hour before or 2 hours after meals.  . cholecalciferol (VITAMIN D) 1000 UNITS tablet Take 1,000 Units by mouth daily.  . Continuous Blood Gluc Sensor (FREESTYLE LIBRE 14 DAY SENSOR) MISC Inject 1 each into the skin every 14 (fourteen) days. Use as directed.  . Cranberry 400 MG TABS Take 4,200 mg by mouth daily.  . Fish Oil-Cholecalciferol (FISH OIL + D3) 1000-1000 MG-UNIT CAPS Take by mouth.  . Insulin Glargine 300 UNIT/ML SOPN Inject 40 Units into the skin at bedtime.  . insulin lispro (HUMALOG KWIKPEN) 100 UNIT/ML KiwkPen Inject 0.1-0.16 mLs (10-16 Units total) into the skin 3 (three) times daily before meals.  . Insulin Pen Needle (BD PEN NEEDLE NANO U/F) 32G X 4 MM MISC 1 each by Does not apply route 4 (  four) times daily.  . IRON PO Take 26 mg by mouth 2 (two) times daily.  . pravastatin (PRAVACHOL) 10 MG tablet Take 10 mg by mouth daily.  Marland Kitchen pyridoxine (B-6) 100 MG tablet Take 100 mg by mouth daily.  . verapamil (COVERA HS) 240 MG (CO) 24 hr tablet Take 240 mg by mouth at bedtime.  . vitamin B-12 (CYANOCOBALAMIN) 1000 MCG tablet Take 1,000 mcg by mouth daily.  . [DISCONTINUED] Continuous Blood Gluc Receiver (FREESTYLE LIBRE 14 DAY READER) DEVI 1 each by Does not apply route every 14 (fourteen) days.  . [DISCONTINUED] gabapentin (NEURONTIN) 300 MG capsule Take 300 mg by mouth daily.  . [DISCONTINUED] HUMALOG KWIKPEN 100 UNIT/ML KiwkPen INJECT 15 TO 21 UNITS UNDER THE SKIN THREE TIMES A DAY BEFORE MEALS (USE ONLY WHEN PRE MEAL BLOOD GLUCOSE IS ABOVE 90 MG/DL) (Patient taking differently: INJECT 10 TO 16 UNITS UNDER THE SKIN THREE TIMES A DAY BEFORE MEALS (USE ONLY WHEN PRE MEAL BLOOD GLUCOSE IS ABOVE 90 MG/DL))  . [DISCONTINUED] TOUJEO  SOLOSTAR 300 UNIT/ML SOPN INJECT 40 UNITS UNDER THE SKIN EVERY NIGHT AT BEDTIME   No facility-administered encounter medications on file as of 12/17/2017.    ALLERGIES: No Known Allergies VACCINATION STATUS:  There is no immunization history on file for this patient.  Diabetes  She presents for her follow-up diabetic visit. She has type 2 diabetes mellitus. Onset time: He was diagnosed at approximate age of 73 years. Her disease course has been stable. There are no hypoglycemic associated symptoms. Pertinent negatives for hypoglycemia include no confusion, headaches, pallor or seizures. There are no diabetic associated symptoms. Pertinent negatives for diabetes include no chest pain, no polydipsia, no polyphagia and no polyuria. There are no hypoglycemic complications. 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 She was found to have severe hyperglycemia in to 400s, this necessitated increasing her Toujeo to 50 units daily. ). Diabetic complications include retinopathy. Risk factors for coronary artery disease include diabetes mellitus, dyslipidemia, hypertension and tobacco exposure. Current diabetic treatment includes insulin injections and oral agent (dual therapy). Her weight is increasing steadily. She is following a generally unhealthy diet. She has had a previous visit with a dietitian. She rarely participates in exercise. Her breakfast blood glucose range is generally 130-140 mg/dl. Her lunch blood glucose range is generally 130-140 mg/dl. Her dinner blood glucose range is generally 130-140 mg/dl. Her bedtime blood glucose range is generally 130-140 mg/dl. Her overall blood glucose range is 130-140 mg/dl. An ACE inhibitor/angiotensin II receptor blocker is being taken. Eye exam is current.  Hyperlipidemia  This is a chronic problem. The current episode started more than 1 year ago. The problem is controlled. Pertinent negatives include  no chest pain, myalgias or shortness of breath. Current antihyperlipidemic treatment includes statins. Risk factors for coronary artery disease include diabetes mellitus, obesity, a sedentary lifestyle and post-menopausal.  Hypertension  This is a chronic problem. The current episode started more than 1 year ago. The problem is controlled. Pertinent negatives include no chest pain, headaches, palpitations or shortness of breath. Risk factors for coronary artery disease include diabetes mellitus and dyslipidemia. Past treatments include calcium channel blockers. Hypertensive end-organ damage includes retinopathy.    Review of Systems  Constitutional: Negative for unexpected weight change.  HENT: Negative for trouble swallowing and voice change.   Eyes: Negative for visual disturbance.  Respiratory: Negative for cough, shortness of breath and wheezing.   Cardiovascular: Negative for chest pain, palpitations  and leg swelling.  Gastrointestinal: Negative for diarrhea, nausea and vomiting.  Endocrine: Negative for cold intolerance, heat intolerance, polydipsia, polyphagia and polyuria.  Musculoskeletal: Negative for arthralgias and myalgias.  Skin: Negative for color change, pallor, rash and wound.  Neurological: Negative for seizures and headaches.  Psychiatric/Behavioral: Negative for confusion and suicidal ideas.    Objective:    BP 135/80   Pulse 84   Ht 5\' 4"  (1.626 m)   Wt 184 lb (83.5 kg)   BMI 31.58 kg/m   Wt Readings from Last 3 Encounters:  12/17/17 184 lb (83.5 kg)  12/17/17 184 lb (83.5 kg)  08/13/17 178 lb (80.7 kg)    Physical Exam  Constitutional: She is oriented to person, place, and time. She appears well-developed.  HENT:  Head: Normocephalic and atraumatic.  Eyes: EOM are normal.  Neck: Normal range of motion. Neck supple. No tracheal deviation present. No thyromegaly present.  Cardiovascular: Normal rate.  Pulmonary/Chest: Effort normal.  Abdominal: There is no  tenderness. There is no guarding.  Musculoskeletal: Normal range of motion. She exhibits no edema.  Neurological: She is alert and oriented to person, place, and time. No cranial nerve deficit. Coordination normal.  Skin: Skin is warm and dry. No rash noted. No erythema. No pallor.  Psychiatric: She has a normal mood and affect. Judgment normal.    Results for orders placed or performed in visit on 08/13/17  T4, free  Result Value Ref Range   Free T4 1.1 0.8 - 1.8 ng/dL  TSH  Result Value Ref Range   TSH 2.59 0.40 - 4.50 mIU/L  Basic metabolic panel  Result Value Ref Range   BUN 18 4 - 21   Creatinine 0.8 0.5 - 1.1  Hemoglobin A1c  Result Value Ref Range   Hemoglobin A1C 6.9    Lipid Panel     Component Value Date/Time   CHOL 128 01/24/2016   TRIG 88 01/24/2016   HDL 43 01/24/2016   LDLCALC 67 01/24/2016    Assessment & Plan:   1. Uncontrolled type 2 diabetes mellitus with complication, with long-term current use of insulin ([place])  -She returns with stable glycemic profile and A1c of 7.2%, generally improving from 11.2%.  She does not have documented or reported major hypoglycemia.  Patient remains at a high risk for more acute and chronic complications of diabetes which include CAD, CVA, CKD, retinopathy, and neuropathy. These are all discussed in detail with the patient. Glucose logs and insulin administration records pertaining to this visit,  to be scanned into patient's records.  Recent labs reviewed.   - I have re-counseled the patient on diet management  by adopting a carbohydrate restricted / protein rich  Diet.  -  Suggestion is made for her to avoid simple carbohydrates  from her diet including Cakes, Sweet Desserts / Pastries, Ice Cream, Soda (diet and regular), Sweet Tea, Candies, Chips, Cookies, Store Bought Juices, Alcohol in Excess of  1-2 drinks a day, Artificial Sweeteners, and "Sugar-free" Products. This will help patient to have stable blood glucose profile  and potentially avoid unintended weight gain.   - Patient is advised to stick to a routine mealtimes to eat 3 meals  a day and avoid unnecessary snacks ( to snack only to correct hypoglycemia).   - I have approached patient with the following individualized plan to manage diabetes and patient agrees.  - She has responded to recent adjustment in her basal insulin.  I advised her to continue Toujeo  40 units nightly,  and continue Humalog 10 units 3 times a day before meals for pre-meal glucose above 90 mg/dL,  associated with strict documenting blood glucose at least 4 times a day-before meals and at bedtime.   -She is wearing the Libre CGM device-advised to continue to wear it at all times.   -Patient is encouraged to call clinic for blood glucose levels less than 70 or above 200 mg /dl x 3 readings in a row.  -  She will stay off of metformin for now.   - She is not a good candidate for incretin therapy, SGLT2 inhibitors . - Patient specific target  for A1c; LDL, HDL, Triglycerides, and  Waist Circumference were discussed in detail.  2) BP/HTN: Her blood pressure is controlled to target.  I advised her to continue her current blood pressure medications including verapamil 240 mg daily.    3) Lipids/HPL: Her lipid panel is controlled with LDL of 67.  She is advised to continue pravastatin 10 mg p.o. nightly.     4)  Weight/Diet: CDE consult in progress, exercise, and carbohydrates information provided. -Given her significant weight gain recently, I offered her thyroid function tests.  5) Chronic Care/Health Maintenance:  -Patient is on ACEI/ARB and Statin medications and encouraged to continue to follow up with Ophthalmology, Podiatrist at least yearly or according to recommendations, and advised to  stay away from smoking. I have recommended yearly flu vaccine and pneumonia vaccination at least every 5 years; moderate intensity exercise for up to 150 minutes weekly; and  sleep for at  least 7 hours a day.  - I advised patient to maintain close follow up with her PCP for primary care needs. - Time spent with the patient: 25 min, of which >50% was spent in reviewing her blood glucose logs , discussing her hypo- and hyper-glycemic episodes, reviewing her current and  previous labs and insulin doses and developing a plan to avoid hypo- and hyper-glycemia. Please refer to Patient Instructions for Blood Glucose Monitoring and Insulin/Medications Dosing Guide"  in media tab for additional information. Taylor Wilkins participated in the discussions, expressed understanding, and voiced agreement with the above plans.  All questions were answered to her satisfaction. she is encouraged to contact clinic should she have any questions or concerns prior to her return visit.   Follow up plan: -Return in about 3 months (around 03/19/2018) for Follow up with Pre-visit Labs, Meter, and Logs.  Glade Lloyd, MD Phone: [phone]  Fax: [phone]  This note was partially dictated with voice recognition software. Similar sounding words can be transcribed inadequately or may not  be corrected upon review.  12/17/2017, 1:26 PM

## 2017-12-17 NOTE — Progress Notes (Signed)
  Medical Nutrition Therapy:  Appt start time: 1100 end time:  1130  Assessment:  Primary concerns today: Diabetes. Lives with her spouse.  Has worked on cutting out snacks. A1C 7.2%.  Has increased walking 2 miles most days. Has cut down on diet sodas. Has the Libre and using it. Increased gadapentin due to some pain.  Chol readings per pt: TCHOL 126 mg/dl, TG 72 mg/dl and HDL 37 mg/dl. Lab Results  Component Value Date   HGBA1C 6.9 08/06/2017   CMP Latest Ref Rng & Units 08/06/2017 05/07/2017 02/06/2017  BUN 4 - 21 18 17 17  Creatinine 0.5 - 1.1 0.8 0.8 0.7      Preferred Learning Style:   No preference indicated   Learning Readiness:    Ready  Change in progress   MEDICATIONS:    DIETARY INTAKE:   24-hr recall:  B ( AM): 1 egg, 1 slice toast,  Snk ( AM  L ( PM): Tomato sandwich, water D ( PM): Hamburger with cheese at home, chips, water Snk ( PM): f Beverages: water,  Usual physical activity: walk 1-2 miles most days of week.  Estimated energy needs: 1200  calories 135 g carbohydrates 90 g protein 33 g fat  Progress Towards Goal(s):  In progress.   Nutritional Diagnosis:  NB-1.1 Food and nutrition-related knowledge deficit As related to Diabetes.  As evidenced by A1C 6.5%..    Intervention:  Nutrition and Diabetes education provided on My Plate, CHO counting, meal planning, portion sizes, timing of meals, avoiding snacks between meals unless having a low blood sugar, target ranges for A1C and blood sugars, signs/symptoms and treatment of hyper/hypoglycemia, monitoring blood sugars, taking medications as prescribed, benefits of exercising 30 minutes per day and prevention of complications of DM.  Goals 1. Lose 1 lb per week 2. Continue walking after dinner 3.  Eat 30 -45 grams carbs per meal 4. Keep drinking more water.   Teaching Method Utilized:  Visual Auditory Hands on  Handouts given during visit include:  The Plate MEthod   Meal Plan  Card*  Barriers to learning/adherence to lifestyle change: none  Demonstrated degree of understanding via:  Teach Back   Monitoring/Evaluation:  Dietary intake, exercise, meal planning, and body weight in 3 month(s).   

## 2017-12-17 NOTE — Patient Instructions (Signed)
Goals 1. Lose 1 lb per week 2. Continue walking after dinner 3.  Eat 30 -45 grams carbs per meal 4. Keep drinking more water.

## 2017-12-26 ENCOUNTER — Other Ambulatory Visit: Payer: Self-pay | Admitting: "Endocrinology

## 2017-12-31 ENCOUNTER — Other Ambulatory Visit: Payer: Self-pay | Admitting: "Endocrinology

## 2018-01-01 ENCOUNTER — Encounter: Payer: Self-pay | Admitting: Nutrition

## 2018-02-14 ENCOUNTER — Other Ambulatory Visit: Payer: Self-pay | Admitting: "Endocrinology

## 2018-03-13 LAB — HEMOGLOBIN A1C: Hemoglobin A1C: 7.4

## 2018-03-13 LAB — BASIC METABOLIC PANEL
BUN: 14 (ref 4–21)
Creatinine: 0.8 (ref 0.5–1.1)

## 2018-03-19 ENCOUNTER — Encounter: Payer: Self-pay | Admitting: "Endocrinology

## 2018-03-19 ENCOUNTER — Encounter: Payer: BLUE CROSS/BLUE SHIELD | Attending: Family Medicine | Admitting: Nutrition

## 2018-03-19 ENCOUNTER — Ambulatory Visit (INDEPENDENT_AMBULATORY_CARE_PROVIDER_SITE_OTHER): Payer: BLUE CROSS/BLUE SHIELD | Admitting: "Endocrinology

## 2018-03-19 VITALS — BP 137/85 | HR 72 | Ht 64.0 in | Wt 189.0 lb

## 2018-03-19 DIAGNOSIS — I10 Essential (primary) hypertension: Secondary | ICD-10-CM | POA: Diagnosis not present

## 2018-03-19 DIAGNOSIS — E118 Type 2 diabetes mellitus with unspecified complications: Secondary | ICD-10-CM | POA: Diagnosis not present

## 2018-03-19 DIAGNOSIS — Z794 Long term (current) use of insulin: Secondary | ICD-10-CM

## 2018-03-19 DIAGNOSIS — E1165 Type 2 diabetes mellitus with hyperglycemia: Secondary | ICD-10-CM | POA: Diagnosis not present

## 2018-03-19 DIAGNOSIS — E782 Mixed hyperlipidemia: Secondary | ICD-10-CM

## 2018-03-19 DIAGNOSIS — IMO0002 Reserved for concepts with insufficient information to code with codable children: Secondary | ICD-10-CM

## 2018-03-19 NOTE — Patient Instructions (Signed)

## 2018-03-19 NOTE — Progress Notes (Signed)
  Medical Nutrition Therapy:  Appt start time: 1100 end time:  1130  Assessment:  Primary concerns today: Diabetes. Lives with her spouse.  Has worked on cutting out snacks. A1C 7.2%.  Has increased walking 2 miles most days. Has cut down on diet sodas. Has the [place] and using it. Increased gadapentin due to some pain.  Chol readings per pt: TCHOL 126 mg/dl, TG 72 mg/dl and HDL 37 mg/dl. Lab Results  Component Value Date   HGBA1C 6.9 08/06/2017   CMP Latest Ref Rng & Units 08/06/2017 05/07/2017 02/06/2017  BUN 4 - 21 18 17 17   Creatinine 0.5 - 1.1 0.8 0.8 0.7      Preferred Learning Style:   No preference indicated   Learning Readiness:    Ready  Change in progress   MEDICATIONS:    DIETARY INTAKE:   24-hr recall:  B ( AM): 1 egg, 1 slice toast,  Snk ( AM  L ( PM): Tomato sandwich, water D ( PM): Hamburger with cheese at home, chips, water Snk ( PM): f Beverages: water,  Usual physical activity: walk 1-2 miles most days of week.  Estimated energy needs: 1200  calories 135 g carbohydrates 90 g protein 33 g fat  Progress Towards Goal(s):  In progress.   Nutritional Diagnosis:  NB-1.1 Food and nutrition-related knowledge deficit As related to Diabetes.  As evidenced by A1C 6.5%..    Intervention:  Nutrition and Diabetes education provided on My Plate, CHO counting, meal planning, portion sizes, timing of meals, avoiding snacks between meals unless having a low blood sugar, target ranges for A1C and blood sugars, signs/symptoms and treatment of hyper/hypoglycemia, monitoring blood sugars, taking medications as prescribed, benefits of exercising 30 minutes per day and prevention of complications of DM.  Goals 1. Lose 1 lb per week 2. Continue walking after dinner 3.  Eat 30 -45 grams carbs per meal 4. Keep drinking more water.   Teaching Method Utilized:  Visual Auditory Hands on  Handouts given during visit include:  The Plate MEthod   Meal Plan  Card*  Barriers to learning/adherence to lifestyle change: none  Demonstrated degree of understanding via:  Teach Back   Monitoring/Evaluation:  Dietary intake, exercise, meal planning, and body weight in 3 month(s).

## 2018-03-19 NOTE — Progress Notes (Signed)
Endocrinology follow-up note  Subjective:    Patient ID: Taylor Wilkins, female    DOB: 03-24-1946, PCP Tillman Abide, FNP   Past Medical History:  Diagnosis Date  . Cancer ([place])    Basal cell on face  . Chest pain 08/2008   normal coronary angiogram  . Diabetes ([place])   . Hyperlipemia   . Hypertension   . 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 Sleep apnea    Past Surgical History:  Procedure Laterality Date  . ABDOMINAL HYSTERECTOMY    . ANKLE SURGERY  2006  . TONSILLECTOMY  1957   Social History   Socioeconomic History  . Marital status: Married    Spouse name: Not on file  . Number of children: Not on file  . Years of education: Not on file  . Highest education level: Not on file  Occupational History  . Not on file  Social Needs  . Financial resource strain: Not on file  . Food insecurity:    Worry: Not on file    Inability: Not on file  . Transportation needs:    Medical: Not on file    Non-medical: Not on file  Tobacco Use  . Smoking status: Never Smoker  . Smokeless tobacco: Never Used  Substance and Sexual Activity  . Alcohol use: Never    Frequency: Never  . Drug use: Never  . Sexual activity: Not on file  Lifestyle  . Physical activity:    Days per week: Not on file    Minutes per session: Not on file  . Stress: Not on file  Relationships  . Social connections:    Talks on phone: Not on file    Gets together: Not on file    Attends religious service: Not on file    Active member of club or organization: Not on file    Attends meetings of clubs or organizations: Not on file    Relationship status: Not on file  Other Topics Concern  . Not on file  Social History Narrative  . Not on file   Outpatient Encounter Medications as of 03/19/2018  Medication Sig  . ACCU-CHEK GUIDE test strip USE TO TEST BLOOD SUGAR FOUR TIMES A DAY  . aspirin 81 MG tablet Take 81 mg by mouth daily.  . B-D UF III MINI PEN NEEDLES 31G X 5 MM MISC USE 1 NEEDLE AS  DIRECTED  . B-D ULTRAFINE III SHORT PEN 31G X 8 MM MISC USE TO INJECT INSULIN AS DIRECTED  . Calcium Citrate-Vitamin D (CALCIUM + D PO) Take by mouth.  . cholecalciferol (VITAMIN D) 1000 UNITS tablet Take 1,000 Units by mouth daily.  . Continuous Blood Gluc Sensor (FREESTYLE LIBRE 14 DAY SENSOR) MISC Inject 1 each into the skin every 14 (fourteen) days. Use as directed.  . Continuous Blood Gluc Sensor (FREESTYLE LIBRE 14 DAY SENSOR) MISC INJECT ONE SENSOR INTO THE SKIN EVERY 14 DAYS  . Cranberry 400 MG TABS Take 4,200 mg by mouth daily.  . Fish Oil-Cholecalciferol (FISH OIL + D3) 1000-1000 MG-UNIT CAPS Take by mouth.  . gabapentin (NEURONTIN) 300 MG capsule Take 300 mg by mouth 2 (two) times daily.  . Insulin Glargine 300 UNIT/ML SOPN Inject 44 Units into the skin at bedtime.  . insulin lispro (HUMALOG KWIKPEN) 100 UNIT/ML KiwkPen Inject 0.1-0.16 mLs (10-16 Units total) into the skin 3 (three) times daily before meals.  . Insulin Pen Needle (BD PEN NEEDLE NANO U/F) 32G X 4  MM MISC 1 each by Does not apply route 4 (four) times daily.  . IRON PO Take 26 mg by mouth 2 (two) times daily.  . pravastatin (PRAVACHOL) 10 MG tablet Take 10 mg by mouth daily.  Marland Kitchen pyridoxine (B-6) 100 MG tablet Take 100 mg by mouth daily.  . TOUJEO SOLOSTAR 300 UNIT/ML SOPN INJECT 40 UNITS UNDER THE SKIN AT BEDTIME  . verapamil (COVERA HS) 240 MG (CO) 24 hr tablet Take 240 mg by mouth at bedtime.  . vitamin B-12 (CYANOCOBALAMIN) 1000 MCG tablet Take 1,000 mcg by mouth daily.  . [DISCONTINUED] chlorambucil (LEUKERAN) 2 MG tablet Take 2 mg by mouth every 14 (fourteen) days. Give on an empty stomach 1 hour before or 2 hours after meals.   No facility-administered encounter medications on file as of 03/19/2018.    ALLERGIES: No Known Allergies VACCINATION STATUS:  There is no immunization history on file for this patient.  Diabetes  She presents for her follow-up diabetic visit. She has type 2 diabetes mellitus. Onset  time: He was diagnosed at approximate age of 29 years. Her disease course has been fluctuating. There are no hypoglycemic associated symptoms. Pertinent negatives for hypoglycemia include no confusion, headaches, pallor or seizures. There are no diabetic associated symptoms. Pertinent negatives for diabetes include no chest pain, no polydipsia, no polyphagia and no polyuria. There are no hypoglycemic complications. 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 She was found to have severe hyperglycemia in to 400s, this necessitated increasing her Toujeo to 50 units daily. ). Diabetic complications include retinopathy. Risk factors for coronary artery disease include diabetes mellitus, dyslipidemia, hypertension and tobacco exposure. Current diabetic treatment includes insulin injections and oral agent (dual therapy). Her weight is increasing steadily. She is following a generally unhealthy diet. She has had a previous visit with a dietitian. She rarely participates in exercise. Her breakfast blood glucose range is generally 130-140 mg/dl. Her lunch blood glucose range is generally 130-140 mg/dl. Her dinner blood glucose range is generally 130-140 mg/dl. Her bedtime blood glucose range is generally 130-140 mg/dl. Her overall blood glucose range is 130-140 mg/dl. An ACE inhibitor/angiotensin II receptor blocker is being taken. Eye exam is current.  Hyperlipidemia  This is a chronic problem. The current episode started more than 1 year ago. The problem is controlled. Pertinent negatives include no chest pain, myalgias or shortness of breath. Current antihyperlipidemic treatment includes statins. Risk factors for coronary artery disease include diabetes mellitus, obesity, a sedentary lifestyle and post-menopausal.  Hypertension  This is a chronic problem. The current episode started more than 1 year ago. The problem is controlled. Pertinent negatives  include no chest pain, headaches, palpitations or shortness of breath. Risk factors for coronary artery disease include diabetes mellitus and dyslipidemia. Past treatments include calcium channel blockers. Hypertensive end-organ damage includes retinopathy.    Review of Systems  Constitutional: Negative for unexpected weight change.  HENT: Negative for trouble swallowing and voice change.   Eyes: Negative for visual disturbance.  Respiratory: Negative for cough, shortness of breath and wheezing.   Cardiovascular: Negative for chest pain, palpitations and leg swelling.  Gastrointestinal: Negative for diarrhea, nausea and vomiting.  Endocrine: Negative for cold intolerance, heat intolerance, polydipsia, polyphagia and polyuria.  Musculoskeletal: Negative for arthralgias and myalgias.  Skin: Negative for color change, pallor, rash and wound.  Neurological: Negative for seizures and headaches.  Psychiatric/Behavioral: Negative for confusion and suicidal ideas.    Objective:  BP 137/85   Pulse 72   Ht 5\' 4"  (1.626 m)   Wt 189 lb (85.7 kg)   BMI 32.44 kg/m   Wt Readings from Last 3 Encounters:  03/19/18 189 lb (85.7 kg)  12/17/17 184 lb (83.5 kg)  12/17/17 184 lb (83.5 kg)    Physical Exam  Constitutional: She is oriented to person, place, and time. She appears well-developed.  HENT:  Head: Normocephalic and atraumatic.  Eyes: EOM are normal.  Neck: Normal range of motion. Neck supple. No tracheal deviation present. No thyromegaly present.  Cardiovascular: Normal rate.  Pulmonary/Chest: Effort normal.  Abdominal: There is no tenderness. There is no guarding.  Musculoskeletal: Normal range of motion. She exhibits no edema.  Neurological: She is alert and oriented to person, place, and time. No cranial nerve deficit. Coordination normal.  Skin: Skin is warm and dry. No rash noted. No erythema. No pallor.  Psychiatric: She has a normal mood and affect. Judgment normal.     Results for orders placed or performed in visit on 70/62/37  Basic metabolic panel  Result Value Ref Range   BUN 14 4 - 21   Creatinine 0.8 0.5 - 1.1  Hemoglobin A1c  Result Value Ref Range   Hemoglobin A1C 7.4    Lipid Panel     Component Value Date/Time   CHOL 128 01/24/2016   TRIG 88 01/24/2016   HDL 43 01/24/2016   LDLCALC 67 01/24/2016    Assessment & Plan:   1. Uncontrolled type 2 diabetes mellitus with complication, with long-term current use of insulin ([place])  -She returns with increased A1c of 7.4% from 6.9%.    - She does not have documented or reported major hypoglycemia.  Patient remains at a high risk for more acute and chronic complications of diabetes which include CAD, CVA, CKD, retinopathy, and neuropathy. These are all discussed in detail with the patient. Glucose logs and insulin administration records pertaining to this visit,  to be scanned into patient's records.  Recent labs reviewed.   - I have re-counseled the patient on diet management  by adopting a carbohydrate restricted / protein rich  Diet.  She still admits to dietary indiscretions including consumption of soda. -  Suggestion is made for her to avoid simple carbohydrates  from her diet including Cakes, Sweet Desserts / Pastries, Ice Cream, Soda (diet and regular), Sweet Tea, Candies, Chips, Cookies, Store Bought Juices, Alcohol in Excess of  1-2 drinks a day, Artificial Sweeteners, and "Sugar-free" Products. This will help patient to have stable blood glucose profile and potentially avoid unintended weight gain.  - Patient is advised to stick to a routine mealtimes to eat 3 meals  a day and avoid unnecessary snacks ( to snack only to correct hypoglycemia).   - I have approached patient with the following individualized plan to manage diabetes and patient agrees.  - She would continue to need intensive treatment with basal/bolus insulin.   -She is advised to increase her Toujeo to 44  units  nightly,  and continue Humalog 10 units 3 times a day before meals for pre-meal glucose above 90 mg/dL,  associated with strict documenting blood glucose at least 4 times a day-before meals and at bedtime.   -She is wearing the Libre CGM device-advised to continue to wear it at all times.   -Patient is encouraged to call clinic for blood glucose levels less than 70 or above 200 mg /dl x 3 readings in a row.  -  She will stay off of metformin for now.   - She is not a good candidate for incretin therapy, SGLT2 inhibitors . - Patient specific target  for A1c; LDL, HDL, Triglycerides, and  Waist Circumference were discussed in detail.  2) BP/HTN: Her blood pressure is controlled to target.  She is advised to continue her current blood pressure medications including verapamil 240 mg daily.    3) Lipids/HPL: Her lipid panel is controlled with LDL of 67.  She is advised to continue pravastatin 10 mg p.o. nightly.     4)  Weight/Diet: CDE consult in progress, exercise, and carbohydrates information provided. -Given her significant weight gain recently, I offered her thyroid function tests.  5) Chronic Care/Health Maintenance:  -Patient is on ACEI/ARB and Statin medications and encouraged to continue to follow up with Ophthalmology, Podiatrist at least yearly or according to recommendations, and advised to  stay away from smoking. I have recommended yearly flu vaccine and pneumonia vaccination at least every 5 years; moderate intensity exercise for up to 150 minutes weekly; and  sleep for at least 7 hours a day.  - I advised patient to maintain close follow up with her PCP for primary care needs.  - Time spent with the patient: 25 min, of which >50% was spent in reviewing her blood glucose logs , discussing her hypo- and hyper-glycemic episodes, reviewing her current and  previous labs and insulin doses and developing a plan to avoid hypo- and hyper-glycemia. Please refer to Patient Instructions for  Blood Glucose Monitoring and Insulin/Medications Dosing Guide"  in media tab for additional information. Taylor Wilkins participated in the discussions, expressed understanding, and voiced agreement with the above plans.  All questions were answered to her satisfaction. she is encouraged to contact clinic should she have any questions or concerns prior to her return visit.   Follow up plan: -Return in about 6 months (around 09/17/2018) for Meter, and Logs, Meter, and Logs, Follow up with Pre-visit Labs, Meter, and Logs.  Glade Lloyd, MD Phone: [phone]  Fax: [phone]  This note was partially dictated with voice recognition software. Similar sounding words can be transcribed inadequately or may not  be corrected upon review.  03/19/2018, 5:29 PM

## 2018-03-19 NOTE — Patient Instructions (Signed)
Goals 1. Try water aerobics

## 2018-03-23 ENCOUNTER — Other Ambulatory Visit: Payer: Self-pay | Admitting: "Endocrinology

## 2018-04-20 ENCOUNTER — Encounter (INDEPENDENT_AMBULATORY_CARE_PROVIDER_SITE_OTHER): Payer: BLUE CROSS/BLUE SHIELD | Admitting: Ophthalmology

## 2018-04-20 DIAGNOSIS — H43813 Vitreous degeneration, bilateral: Secondary | ICD-10-CM

## 2018-04-20 DIAGNOSIS — I10 Essential (primary) hypertension: Secondary | ICD-10-CM

## 2018-04-20 DIAGNOSIS — H35033 Hypertensive retinopathy, bilateral: Secondary | ICD-10-CM

## 2018-04-20 DIAGNOSIS — E11319 Type 2 diabetes mellitus with unspecified diabetic retinopathy without macular edema: Secondary | ICD-10-CM

## 2018-04-20 DIAGNOSIS — E113393 Type 2 diabetes mellitus with moderate nonproliferative diabetic retinopathy without macular edema, bilateral: Secondary | ICD-10-CM | POA: Diagnosis not present

## 2018-04-20 DIAGNOSIS — H2513 Age-related nuclear cataract, bilateral: Secondary | ICD-10-CM

## 2018-04-21 ENCOUNTER — Other Ambulatory Visit: Payer: Self-pay | Admitting: "Endocrinology

## 2018-05-07 ENCOUNTER — Ambulatory Visit (INDEPENDENT_AMBULATORY_CARE_PROVIDER_SITE_OTHER): Payer: BLUE CROSS/BLUE SHIELD | Admitting: Cardiovascular Disease

## 2018-05-07 ENCOUNTER — Encounter: Payer: Self-pay | Admitting: Cardiovascular Disease

## 2018-05-07 VITALS — BP 136/84 | HR 75 | Ht 65.0 in | Wt 190.0 lb

## 2018-05-07 DIAGNOSIS — I10 Essential (primary) hypertension: Secondary | ICD-10-CM | POA: Diagnosis not present

## 2018-05-07 DIAGNOSIS — E782 Mixed hyperlipidemia: Secondary | ICD-10-CM | POA: Diagnosis not present

## 2018-05-07 NOTE — Patient Instructions (Signed)
Medication Instructions:  Your physician recommends that you continue on your current medications as directed. Please refer to the Current Medication list given to you today.  If you need a refill on your cardiac medications before your next appointment, please call your pharmacy.   Lab work: NONE If you have labs (blood work) drawn today and your tests are completely normal, you will receive your results only by: . MyChart Message (if you have MyChart) OR . A paper copy in the mail If you have any lab test that is abnormal or we need to change your treatment, we will call you to review the results.  Testing/Procedures: NONE  Follow-Up: At [facility], you and your health needs are our priority.  As part of our continuing mission to provide you with exceptional heart care, we have created designated Provider Care Teams.  These Care Teams include your primary Cardiologist (physician) and Advanced Practice Providers (APPs -  Physician Assistants and Nurse Practitioners) who all work together to provide you with the care you need, when you need it. You will need a follow up appointment in 12 months.  Please call our office 2 months in advance to schedule this appointment.  You may see DR. BERRY or one of the following Advanced Practice Providers on your designated Care Team:   Luke Kilroy, PA-C Krista Kroeger, PA-C . Callie Goodrich, PA-C    

## 2018-05-07 NOTE — Assessment & Plan Note (Signed)
History of hyperlipidemia on statin therapy followed by her PCP. 

## 2018-05-07 NOTE — Assessment & Plan Note (Signed)
History of essential hypertension her blood pressure measured today 136/84.  She is on lisinopril and verapamil.

## 2018-05-07 NOTE — Progress Notes (Signed)
05/07/2018 Taylor Wilkins   1945/08/11  [id number]  Primary Physician Tillman Abide, [place] Primary Cardiologist: Lorretta Harp MD [name], [place]  HPI:  Taylor Wilkins is a 72 y.o.  married Caucasian female mother of 2, grandmother and 6 grandchildren referred by Rip Harbour WinickerFNP in [place] to be reestablished our practice. I last saw her in the office 05/02/2017. She had previously seen Dr. Elisabeth Cara in our practice years ago and apparently had a normal cardiac catheterization after false positive GXT. She does work as a Teacher, early years/pre. She has cardiac risk factors notable for 2 hypertension and hyperlipidemia as well as diabetes. She does not smoke. Her mother did die of a myocardial infarction at age 53. She had new onset dyspnea on exertion over several months prior to her last office visit which prompted obtaining a 2-D echo and a Myoview stress test on 11/08/16 which were normal. Her symptoms have since resolved. She has had CLL for last 3 years and has recently finished chemotherapy for this.  This resulted in her beginning insulin and steroids which has apparently led to a 15 pound weight gain.  Since I saw her a year ago she is remained stable.  Her major complaints are of bursitis and some atypical leg pain which does not sound like claudication.  She denies chest pain or shortness of breath.    Current Meds  Medication Sig  . ACCU-CHEK GUIDE test strip USE TO TEST BLOOD SUGAR FOUR TIMES A DAY  . aspirin 81 MG tablet Take 81 mg by mouth daily.  . B-D UF III MINI PEN NEEDLES 31G X 5 MM MISC USE 1 NEEDLE AS DIRECTED  . Calcium Citrate-Vitamin D (CALCIUM + D PO) Take by mouth.  . cholecalciferol (VITAMIN D) 1000 UNITS tablet Take 1,000 Units by mouth daily.  . Continuous Blood Gluc Sensor (FREESTYLE LIBRE 14 DAY SENSOR) MISC INJECT ONE SENSOR INTO THE SKIN EVERY 14 DAYS  . Cranberry 400 MG TABS Take 4,200 mg by mouth daily.  . Fish Oil-Cholecalciferol (FISH  OIL + D3) 1000-1000 MG-UNIT CAPS Take by mouth.  . gabapentin (NEURONTIN) 300 MG capsule Take 300 mg by mouth 2 (two) times daily.  . Insulin Glargine 300 UNIT/ML SOPN Inject 44 Units into the skin at bedtime.  . insulin lispro (HUMALOG KWIKPEN) 100 UNIT/ML KiwkPen Inject 0.1-0.16 mLs (10-16 Units total) into the skin 3 (three) times daily before meals.  . Insulin Pen Needle (BD PEN NEEDLE NANO U/F) 32G X 4 MM MISC 1 each by Does not apply route 4 (four) times daily.  . IRON PO Take 26 mg by mouth 2 (two) times daily.  Marland Kitchen lisinopril (PRINIVIL,ZESTRIL) 10 MG tablet Take 10 mg by mouth at bedtime.  . pravastatin (PRAVACHOL) 10 MG tablet Take 10 mg by mouth daily.  . TOUJEO SOLOSTAR 300 UNIT/ML SOPN INJECT 40 UNITS UNDER THE SKIN AT BEDTIME  . verapamil (COVERA HS) 240 MG (CO) 24 hr tablet Take 240 mg by mouth at bedtime.  . vitamin B-12 (CYANOCOBALAMIN) 1000 MCG tablet Take 1,000 mcg by mouth daily.  . [DISCONTINUED] B-D ULTRAFINE III SHORT PEN 31G X 8 MM MISC USE TO INJECT INSULIN AS DIRECTED  . [DISCONTINUED] Continuous Blood Gluc Sensor (FREESTYLE LIBRE 14 DAY SENSOR) MISC Inject 1 each into the skin every 14 (fourteen) days. Use as directed.  . [DISCONTINUED] Continuous Blood Gluc Sensor (FREESTYLE LIBRE 14 DAY SENSOR) MISC INJECT ONE SENSOR INTO THE SKIN EVERY 14 DAYS  . [  DISCONTINUED] pyridoxine (B-6) 100 MG tablet Take 100 mg by mouth daily.     No Known Allergies  Social History   Socioeconomic History  . Marital status: Married    Spouse name: Not on file  . Number of children: Not on file  . Years of education: Not on file  . Highest education level: Not on file  Occupational History  . Not on file  Social Needs  . Financial resource strain: Not on file  . Food insecurity:    Worry: Not on file    Inability: Not on file  . Transportation needs:    Medical: Not on file    Non-medical: Not on file  Tobacco Use  . Smoking status: Never Smoker  . Smokeless tobacco: Never  Used  Substance and Sexual Activity  . Alcohol use: Never    Frequency: Never  . Drug use: Never  . Sexual activity: Not on file  Lifestyle  . Physical activity:    Days per week: Not on file    Minutes per session: Not on file  . Stress: Not on file  Relationships  . Social connections:    Talks on phone: Not on file    Gets together: Not on file    Attends religious service: Not on file    Active member of club or organization: Not on file    Attends meetings of clubs or organizations: Not on file    Relationship status: Not on file  . Intimate partner violence:    Fear of current or ex partner: Not on file    Emotionally abused: Not on file    Physically abused: Not on file    Forced sexual activity: Not on file  Other Topics Concern  . Not on file  Social History Narrative  . Not on file     Review of Systems: General: negative for chills, fever, night sweats or weight changes.  Cardiovascular: negative for chest pain, dyspnea on exertion, edema, orthopnea, palpitations, paroxysmal nocturnal dyspnea or shortness of breath Dermatological: negative for rash Respiratory: negative for cough or wheezing Urologic: negative for hematuria Abdominal: negative for nausea, vomiting, diarrhea, bright red blood per rectum, melena, or hematemesis Neurologic: negative for visual changes, syncope, or dizziness All other systems reviewed and are otherwise negative except as noted above.    Blood pressure 136/84, pulse 75, height 5\' 5"  (1.651 m), weight 190 lb (86.2 kg).  General appearance: alert and no distress Neck: no adenopathy, no carotid bruit, no JVD, supple, symmetrical, trachea midline and thyroid not enlarged, symmetric, no tenderness/mass/nodules Lungs: clear to auscultation bilaterally Heart: regular rate and rhythm, S1, S2 normal, no murmur, click, rub or gallop Extremities: extremities normal, atraumatic, no cyanosis or edema Pulses: 2+ and symmetric Skin: Skin  color, texture, turgor normal. No rashes or lesions Neurologic: Alert and oriented X 3, normal strength and tone. Normal symmetric reflexes. Normal coordination and gait  EKG sinus rhythm at 75 without ST or T wave changes. I  Personally reviewed this EKG.  ASSESSMENT AND PLAN:   Mixed hyperlipidemia History of hyperlipidemia on statin therapy followed by her PCP.  Essential hypertension, benign History of essential hypertension her blood pressure measured today 136/84.  She is on lisinopril and verapamil.      Lorretta Harp MD FACP,FACC,FAHA, [facility] 05/07/2018 11:35 AM

## 2018-07-13 ENCOUNTER — Telehealth: Payer: Self-pay | Admitting: "Endocrinology

## 2018-07-13 ENCOUNTER — Other Ambulatory Visit: Payer: Self-pay | Admitting: "Endocrinology

## 2018-07-13 ENCOUNTER — Other Ambulatory Visit: Payer: Self-pay

## 2018-07-13 MED ORDER — FREESTYLE LIBRE 14 DAY SENSOR MISC: 1.0000 | 2 refills | Status: DC

## 2018-07-13 MED ORDER — INSULIN LISPRO 200 UNIT/ML ~~LOC~~ SOPN: 10.0000 [IU] | PEN_INJECTOR | Freq: Three times a day (TID) | SUBCUTANEOUS | 2 refills | Status: DC

## 2018-07-13 NOTE — Telephone Encounter (Signed)
I will take care of this

## 2018-07-13 NOTE — Telephone Encounter (Signed)
I refilled the [place]. I am unable to refill the Humalog. Epic won't allow me to refill it. Patient is completely out of the Humalog

## 2018-07-13 NOTE — Telephone Encounter (Signed)
Pt LVM that the Libra & Humalog needs to be  She started calling last Monday to [facility], and they have nothing yet

## 2018-07-16 ENCOUNTER — Telehealth: Payer: Self-pay

## 2018-07-16 DIAGNOSIS — Z794 Long term (current) use of insulin: Principal | ICD-10-CM

## 2018-07-16 DIAGNOSIS — E1165 Type 2 diabetes mellitus with hyperglycemia: Secondary | ICD-10-CM

## 2018-07-16 DIAGNOSIS — E118 Type 2 diabetes mellitus with unspecified complications: Principal | ICD-10-CM

## 2018-07-16 DIAGNOSIS — IMO0002 Reserved for concepts with insufficient information to code with codable children: Secondary | ICD-10-CM

## 2018-07-16 MED ORDER — INSULIN LISPRO 200 UNIT/ML ~~LOC~~ SOPN: 10.0000 [IU] | PEN_INJECTOR | Freq: Three times a day (TID) | SUBCUTANEOUS | 2 refills | Status: DC

## 2018-07-16 MED ORDER — INSULIN GLARGINE (1 UNIT DIAL) 300 UNIT/ML ~~LOC~~ SOPN: 40.0000 [IU] | PEN_INJECTOR | Freq: Every day | SUBCUTANEOUS | 2 refills | Status: DC

## 2018-07-16 NOTE — Telephone Encounter (Signed)
Taylor Wilkins, CMA  

## 2018-09-14 LAB — HEMOGLOBIN A1C: Hemoglobin A1C: 7.7

## 2018-09-14 LAB — VITAMIN D 25 HYDROXY (VIT D DEFICIENCY, FRACTURES): Vit D, 25-Hydroxy: 44.5

## 2018-09-15 LAB — BASIC METABOLIC PANEL
BUN: 20 (ref 4–21)
Creatinine: 0.9 (ref 0.5–1.1)

## 2018-09-18 ENCOUNTER — Ambulatory Visit (INDEPENDENT_AMBULATORY_CARE_PROVIDER_SITE_OTHER): Payer: BLUE CROSS/BLUE SHIELD | Admitting: "Endocrinology

## 2018-09-18 ENCOUNTER — Encounter: Payer: Self-pay | Admitting: "Endocrinology

## 2018-09-18 ENCOUNTER — Other Ambulatory Visit: Payer: Self-pay

## 2018-09-18 DIAGNOSIS — E782 Mixed hyperlipidemia: Secondary | ICD-10-CM

## 2018-09-18 DIAGNOSIS — I10 Essential (primary) hypertension: Secondary | ICD-10-CM | POA: Diagnosis not present

## 2018-09-18 DIAGNOSIS — E1165 Type 2 diabetes mellitus with hyperglycemia: Secondary | ICD-10-CM | POA: Diagnosis not present

## 2018-09-18 DIAGNOSIS — E118 Type 2 diabetes mellitus with unspecified complications: Secondary | ICD-10-CM

## 2018-09-18 DIAGNOSIS — Z794 Long term (current) use of insulin: Secondary | ICD-10-CM

## 2018-09-18 DIAGNOSIS — IMO0002 Reserved for concepts with insufficient information to code with codable children: Secondary | ICD-10-CM

## 2018-09-18 MED ORDER — INSULIN GLARGINE (2 UNIT DIAL) 300 UNIT/ML ~~LOC~~ SOPN: 48.0000 [IU] | PEN_INJECTOR | Freq: Every day | SUBCUTANEOUS | 3 refills | Status: DC

## 2018-09-18 NOTE — Progress Notes (Signed)
09/18/2018                                                    Endocrinology Telehealth Visit Follow up Note -During COVID -19 Pandemic  This visit type was conducted due to national recommendations for restrictions regarding the COVID-19 Pandemic  in an effort to limit this patient's exposure and mitigate transmission of the corona virus.  Due to her co-morbid illnesses, Taylor Wilkins is at  moderate to high risk for complications without adequate follow up.  This format is felt to be most appropriate for her at this time.  I connected with this patient on 09/18/2018   by telephone and verified that I am speaking with the correct person using two identifiers. Taylor Wilkins, Aug 16, 1945. she has verbally consented to this visit. All issues noted in this document were discussed and addressed. The format was not optimal for physical exam.   Subjective:    Patient ID: Taylor Wilkins, female    DOB: [birth year], PCP Tillman Abide, FNP   Past Medical History:  Diagnosis Date  . Cancer (Taylor Wilkins)    Basal cell on face  . Chest pain 08/2008   normal coronary angiogram  . Diabetes (Taylor Wilkins)   . Hyperlipemia   . Hypertension   . 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 Sleep apnea    Past Surgical History:  Procedure Laterality Date  . ABDOMINAL HYSTERECTOMY    . ANKLE SURGERY  2006  . TONSILLECTOMY  1957   Social History   Socioeconomic History  . Marital status: Married    Spouse name: Not on file  . Number of children: Not on file  . Years of education: Not on file  . Highest education level: Not on file  Occupational History  . Not on file  Social Needs  . Financial resource strain: Not on file  . Food insecurity:    Worry: Not on file    Inability: Not on file  . Transportation needs:    Medical: Not on file    Non-medical: Not on file  Tobacco Use  . Smoking status: Never Smoker  . Smokeless tobacco: Never Used  Substance and Sexual Activity  . Alcohol use: Never    Frequency: Never   . Drug use: Never  . Sexual activity: Not on file  Lifestyle  . Physical activity:    Days per week: Not on file    Minutes per session: Not on file  . Stress: Not on file  Relationships  . Social connections:    Talks on phone: Not on file    Gets together: Not on file    Attends religious service: Not on file    Active member of club or organization: Not on file    Attends meetings of clubs or organizations: Not on file    Relationship status: Not on file  Other Topics Concern  . Not on file  Social History Narrative  . Not on file   Outpatient Encounter Medications as of 09/18/2018  Medication Sig  . ACCU-CHEK GUIDE test strip USE TO TEST BLOOD SUGAR FOUR TIMES A DAY  . aspirin 81 MG tablet Take 81 mg by mouth daily.  . B-D UF III MINI PEN NEEDLES 31G X 5 MM MISC USE 1 NEEDLE AS DIRECTED  .  Calcium Citrate-Vitamin D (CALCIUM + D PO) Take by mouth.  . cholecalciferol (VITAMIN D) 1000 UNITS tablet Take 1,000 Units by mouth daily.  . Continuous Blood Gluc Sensor (FREESTYLE LIBRE 14 DAY SENSOR) MISC Inject 1 each into the skin every 14 (fourteen) days. Inject 1 sensor into the skin every 14 days  . Cranberry 400 MG TABS Take 4,200 mg by mouth daily.  . Fish Oil-Cholecalciferol (FISH OIL + D3) 1000-1000 MG-UNIT CAPS Take by mouth.  . gabapentin (NEURONTIN) 300 MG capsule Take 300 mg by mouth 2 (two) times daily.  . Insulin Glargine, 2 Unit Dial, (TOUJEO MAX SOLOSTAR) 300 UNIT/ML SOPN Inject 48 Units into the skin at bedtime.  . Insulin Lispro (HUMALOG KWIKPEN) 200 UNIT/ML SOPN Inject 10-16 Units into the skin 3 (three) times daily before meals.  . Insulin Pen Needle (BD PEN NEEDLE NANO U/F) 32G X 4 MM MISC 1 each by Does not apply route 4 (four) times daily.  . IRON PO Take 26 mg by mouth 2 (two) times daily.  Marland Kitchen lisinopril (PRINIVIL,ZESTRIL) 10 MG tablet Take 10 mg by mouth at bedtime.  . pravastatin (PRAVACHOL) 10 MG tablet Take 10 mg by mouth daily.  . verapamil (COVERA HS) 240  MG (CO) 24 hr tablet Take 240 mg by mouth at bedtime.  . vitamin B-12 (CYANOCOBALAMIN) 1000 MCG tablet Take 1,000 mcg by mouth daily.  . [DISCONTINUED] Insulin Glargine 300 UNIT/ML SOPN Inject 48 Units into the skin at bedtime.  . [DISCONTINUED] Insulin Glargine, 1 Unit Dial, (TOUJEO SOLOSTAR) 300 UNIT/ML SOPN Place 40 Units onto the skin at bedtime.   No facility-administered encounter medications on file as of 09/18/2018.    ALLERGIES: No Known Allergies VACCINATION STATUS:  There is no immunization history on file for this patient.  Diabetes  She presents for her follow-up diabetic visit. She has type 2 diabetes mellitus. Onset time: He was diagnosed at approximate age of 17 years. Her disease course has been worsening. There are no hypoglycemic associated symptoms. Pertinent negatives for hypoglycemia include no confusion, headaches, pallor or seizures. There are no diabetic associated symptoms. Pertinent negatives for diabetes include no chest pain, no polydipsia, no polyphagia and no polyuria. There are no hypoglycemic complications. 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 She was found to have severe hyperglycemia in to 400s, this necessitated increasing her Toujeo to 50 units daily. ). Diabetic complications include retinopathy. Risk factors for coronary artery disease include diabetes mellitus, dyslipidemia, hypertension and tobacco exposure. Current diabetic treatment includes insulin injections and oral agent (dual therapy). Her weight is increasing steadily. She is following a generally unhealthy diet. She has had a previous visit with a dietitian. She rarely participates in exercise. Her breakfast blood glucose range is generally 140-180 mg/dl. Her lunch blood glucose range is generally 130-140 mg/dl. Her dinner blood glucose range is generally 140-180 mg/dl. Her bedtime blood glucose range is generally 140-180 mg/dl. Her  overall blood glucose range is 130-140 mg/dl. An ACE inhibitor/angiotensin II receptor blocker is being taken. Eye exam is current.  Hyperlipidemia  This is a chronic problem. The current episode started more than 1 year ago. The problem is controlled. Pertinent negatives include no chest pain, myalgias or shortness of breath. Current antihyperlipidemic treatment includes statins. Risk factors for coronary artery disease include diabetes mellitus, obesity, a sedentary lifestyle and post-menopausal.  Hypertension  This is a chronic problem. The current episode started more than 1 year ago. The problem  is controlled. Pertinent negatives include no chest pain, headaches, palpitations or shortness of breath. Risk factors for coronary artery disease include diabetes mellitus and dyslipidemia. Past treatments include calcium channel blockers. Hypertensive end-organ damage includes retinopathy.    Review of systems: Limited as above.  Objective:    There were no vitals taken for this visit.  Wt Readings from Last 3 Encounters:  05/07/18 190 lb (86.2 kg)  03/19/18 189 lb (85.7 kg)  12/17/17 184 lb (83.5 kg)     Results for orders placed or performed in visit on 09/18/18  VITAMIN D 25 Hydroxy (Vit-D Deficiency, Fractures)  Result Value Ref Range   Vit D, 25-Hydroxy 46.2   Basic metabolic panel  Result Value Ref Range   BUN 20 4 - 21   Creatinine 0.9 0.5 - 1.1  Hemoglobin A1c  Result Value Ref Range   Hemoglobin A1C 7.7    Lipid Panel     Component Value Date/Time   CHOL 128 01/24/2016   TRIG 88 01/24/2016   HDL 43 01/24/2016   LDLCALC 67 01/24/2016    Assessment & Plan:   1. Uncontrolled type 2 diabetes mellitus with complication, with long-term current use of insulin ([place])  -She reports slightly above target glycemic profile across the day with A1c of 7.7% increasing from 7.4% during her last visit.  - She does not have documented or reported major hypoglycemia.  Patient remains  at a high risk for more acute and chronic complications of diabetes which include CAD, CVA, CKD, retinopathy, and neuropathy. These are all discussed in detail with the patient. Glucose logs and insulin administration records pertaining to this visit,  to be scanned into patient's records.  Recent labs reviewed.   - I have re-counseled the patient on diet management  by adopting a carbohydrate restricted / protein rich  Diet.  - Patient admits there is a room for improvement in her diet and drink choices. -  Suggestion is made for her to avoid simple carbohydrates  from her diet including Cakes, Sweet Desserts / Pastries, Ice Cream, Soda (diet and regular), Sweet Tea, Candies, Chips, Cookies, Store Bought Juices, Alcohol in Excess of  1-2 drinks a day, Artificial Sweeteners, and "Sugar-free" Products. This will help patient to have stable blood glucose profile and potentially avoid unintended weight gain.   - Patient is advised to stick to a routine mealtimes to eat 3 meals  a day and avoid unnecessary snacks ( to snack only to correct hypoglycemia).   - I have approached patient with the following individualized plan to manage diabetes and patient agrees.  - She would continue to need intensive treatment with basal/bolus insulin.   -She is advised to increase her Toujeo to 48  units nightly,  and continue Humalog 10 units 3 times a day before meals for pre-meal glucose above 90 mg/dL,  associated with strict documenting blood glucose at least 4 times a day-before meals and at bedtime.   -She is wearing the Libre CGM device-advised to continue to wear it at all times.   -Patient is encouraged to call clinic for blood glucose levels less than 70 or above 200 mg /dl x 3 readings in a row.  -  She will stay off of metformin for now.   - She is not a good candidate for incretin therapy, SGLT2 inhibitors . Her anti-GAD and anti-islet cell antibodies are negative.  2) BP/HTN: she is advised to  home monitor blood pressure and report if >  140/90 on 2 separate readings.   She is advised to continue her current blood pressure medications including verapamil 240 mg daily.   3) Lipids/HPL: Her lipid panel is controlled with LDL of 67.  She is advised to continue pravastatin 10 mg p.o. nightly.     4)  Weight/Diet: CDE consult in progress, exercise, and carbohydrates information provided. -Given her significant weight gain recently, I offered her thyroid function tests.  These tests are showing normal thyroid function.  5) Chronic Care/Health Maintenance:  -Patient is on ACEI/ARB and Statin medications and encouraged to continue to follow up with Ophthalmology, Podiatrist at least yearly or according to recommendations, and advised to  stay away from smoking. I have recommended yearly flu vaccine and pneumonia vaccination at least every 5 years; moderate intensity exercise for up to 150 minutes weekly; and  sleep for at least 7 hours a day.  - I advised patient to maintain close follow up with her PCP for primary care needs.  - Time spent with the patient: 25 min, of which >50% was spent in reviewing her blood glucose logs , discussing her hypoglycemia and hyperglycemia episodes, reviewing her current and  previous labs / studies and medications  doses and developing a plan to avoid hypoglycemia and hyperglycemia. Please refer to Patient Instructions for Blood Glucose Monitoring and Insulin/Medications Dosing Guide"  in media tab for additional information. Please  also refer to " Patient Self Inventory" in the Media  tab for reviewed elements of pertinent patient history.  Taylor Wilkins participated in the discussions, expressed understanding, and voiced agreement with the above plans.  All questions were answered to her satisfaction. she is encouraged to contact clinic should she have any questions or concerns prior to her return visit.  Follow up plan: -Return in about 4 months (around  01/19/2019) for Follow up with Pre-visit Labs, Meter, and Logs.  Glade Lloyd, MD Phone: [phone]  Fax: [phone]  This note was partially dictated with voice recognition software. Similar sounding words can be transcribed inadequately or may not  be corrected upon review.  09/18/2018, 5:59 PM

## 2018-09-21 ENCOUNTER — Ambulatory Visit: Payer: BLUE CROSS/BLUE SHIELD | Admitting: "Endocrinology

## 2018-09-21 ENCOUNTER — Ambulatory Visit: Payer: BLUE CROSS/BLUE SHIELD | Admitting: Nutrition

## 2018-09-22 ENCOUNTER — Other Ambulatory Visit: Payer: Self-pay

## 2018-09-22 ENCOUNTER — Encounter: Payer: BLUE CROSS/BLUE SHIELD | Attending: "Endocrinology | Admitting: Nutrition

## 2018-09-22 DIAGNOSIS — IMO0002 Reserved for concepts with insufficient information to code with codable children: Secondary | ICD-10-CM

## 2018-09-22 DIAGNOSIS — E1165 Type 2 diabetes mellitus with hyperglycemia: Secondary | ICD-10-CM

## 2018-09-22 DIAGNOSIS — E118 Type 2 diabetes mellitus with unspecified complications: Secondary | ICD-10-CM | POA: Insufficient documentation

## 2018-09-22 NOTE — Progress Notes (Signed)
  Medical Nutrition Therapy:  Appt start time: 1100 end time:  1115 Assessment:  Primary concerns today: Diabetes. Lives with her spouse.  Has worked on cutting out snacks. A1C 7.7% BS up slightly from not being able to walk much due to heel spurs. Sees Dr. Dorris Fetch, Endocrinology. Has a heel spur and hasn't been able to walk as much. Has been staying home to Covid 19. Will get back walking after the virus. Still using Elenor Legato and loves it and finds it very helpful in assessing her BS. Lantus28 units and 10 units of Humalog plus sliding scale with meals. Chol readings per pt: TCHOL 126 mg/dl, TG 72 mg/dl and HDL 37 mg/dl. Lab Results  Component Value Date   HGBA1C 7.7 09/14/2018   CMP Latest Ref Rng & Units 09/14/2018 03/13/2018 08/06/2017  BUN 4 - 21 20 14 18   Creatinine 0.5 - 1.1 0.9 0.8 0.8      Preferred Learning Style:   No preference indicated   Learning Readiness:    Ready  Change in progress   MEDICATIONS:    DIETARY INTAKE:   24-hr recall:  B ( AM): 1 egg, 1 slice toast,  Snk ( AM  L ( PM): Sandwich, water D ( PM): meat and vegetables.  Snk ( PM):  Beverages: water,  Usual physical activity: walk 1-2 miles most days of week.  Estimated energy needs: 1200  calories 135 g carbohydrates 90 g protein 33 g fat  Progress Towards Goal(s):  In progress.   Nutritional Diagnosis:  NB-1.1 Food and nutrition-related knowledge deficit As related to Diabetes.  As evidenced by A1C 6.5%..    Intervention:  Nutrition and Diabetes education provided on My Plate, CHO counting, meal planning, portion sizes, timing of meals, avoiding snacks between meals unless having a low blood sugar, target ranges for A1C and blood sugars, signs/symptoms and treatment of hyper/hypoglycemia, monitoring blood sugars, taking medications as prescribed, benefits of exercising 30 minutes per day and prevention of complications. Reading food labels.   Increase protein  With meals Increase lower carb  vegetables with lunch and dinner Get back to walking when able. Get A1C to 7% or less.   Teaching Method Utilized:  Visual Auditory Hands on  Handouts given during visit include:  The Plate MEthod   Meal Plan Card*  Barriers to learning/adherence to lifestyle change: none  Demonstrated degree of understanding via:  Teach Back   Monitoring/Evaluation:  Dietary intake, exercise, meal planning, and body weight in 3 month(s).

## 2018-09-26 ENCOUNTER — Other Ambulatory Visit: Payer: Self-pay | Admitting: "Endocrinology

## 2018-09-28 ENCOUNTER — Encounter: Payer: Self-pay | Admitting: Nutrition

## 2018-09-28 NOTE — Patient Instructions (Signed)
Increase protein  With meals Increase lower carb vegetables with lunch and dinner Get back to walking when able. Get A1C to 7% or less.

## 2018-10-20 ENCOUNTER — Other Ambulatory Visit: Payer: Self-pay

## 2018-10-20 ENCOUNTER — Encounter (INDEPENDENT_AMBULATORY_CARE_PROVIDER_SITE_OTHER): Payer: BC Managed Care – PPO | Admitting: Ophthalmology

## 2018-10-20 DIAGNOSIS — H43813 Vitreous degeneration, bilateral: Secondary | ICD-10-CM

## 2018-10-20 DIAGNOSIS — E113393 Type 2 diabetes mellitus with moderate nonproliferative diabetic retinopathy without macular edema, bilateral: Secondary | ICD-10-CM | POA: Diagnosis not present

## 2018-10-20 DIAGNOSIS — E11311 Type 2 diabetes mellitus with unspecified diabetic retinopathy with macular edema: Secondary | ICD-10-CM

## 2018-10-20 DIAGNOSIS — I10 Essential (primary) hypertension: Secondary | ICD-10-CM

## 2018-10-20 DIAGNOSIS — H2513 Age-related nuclear cataract, bilateral: Secondary | ICD-10-CM

## 2018-10-20 DIAGNOSIS — H35033 Hypertensive retinopathy, bilateral: Secondary | ICD-10-CM | POA: Diagnosis not present

## 2018-12-28 ENCOUNTER — Other Ambulatory Visit: Payer: Self-pay | Admitting: "Endocrinology

## 2019-01-14 LAB — BASIC METABOLIC PANEL
BUN: 20 (ref 4–21)
Creatinine: 0.8 (ref 0.5–1.1)
Potassium: 4.7 (ref 3.4–5.3)
Sodium: 141 (ref 137–147)

## 2019-01-14 LAB — HEPATIC FUNCTION PANEL
ALT: 18 (ref 7–35)
AST: 30 (ref 13–35)
Bilirubin, Total: 0.4

## 2019-01-14 LAB — HEMOGLOBIN A1C: Hemoglobin A1C: 7.3

## 2019-01-20 ENCOUNTER — Ambulatory Visit (INDEPENDENT_AMBULATORY_CARE_PROVIDER_SITE_OTHER): Payer: BC Managed Care – PPO | Admitting: "Endocrinology

## 2019-01-20 ENCOUNTER — Encounter: Payer: Self-pay | Admitting: "Endocrinology

## 2019-01-20 ENCOUNTER — Other Ambulatory Visit: Payer: Self-pay | Admitting: Family Medicine

## 2019-01-20 ENCOUNTER — Other Ambulatory Visit: Payer: Self-pay

## 2019-01-20 DIAGNOSIS — E782 Mixed hyperlipidemia: Secondary | ICD-10-CM

## 2019-01-20 DIAGNOSIS — I10 Essential (primary) hypertension: Secondary | ICD-10-CM | POA: Diagnosis not present

## 2019-01-20 DIAGNOSIS — E118 Type 2 diabetes mellitus with unspecified complications: Secondary | ICD-10-CM

## 2019-01-20 DIAGNOSIS — IMO0002 Reserved for concepts with insufficient information to code with codable children: Secondary | ICD-10-CM

## 2019-01-20 DIAGNOSIS — E1165 Type 2 diabetes mellitus with hyperglycemia: Secondary | ICD-10-CM

## 2019-01-20 DIAGNOSIS — Z794 Long term (current) use of insulin: Secondary | ICD-10-CM

## 2019-01-20 NOTE — Progress Notes (Signed)
01/20/2019                                                    Endocrinology Telehealth Visit Follow up Note -During COVID -19 Pandemic  This visit type was conducted due to national recommendations for restrictions regarding the COVID-19 Pandemic  in an effort to limit this patient's exposure and mitigate transmission of the corona virus.  Due to her co-morbid illnesses, Taylor Wilkins is at  moderate to high risk for complications without adequate follow up.  This format is felt to be most appropriate for her at this time.  I connected with this patient on 01/20/2019   by telephone and verified that I am speaking with the correct person using two identifiers. Taylor Wilkins, 1946/01/15. she has verbally consented to this visit. All issues noted in this document were discussed and addressed. The format was not optimal for physical exam.   Subjective:    Patient ID: Taylor Wilkins, female    DOB: 27-Dec-1945, PCP Tillman Abide, FNP   Past Medical History:  Diagnosis Date  . Cancer ([place])    Basal cell on face  . Chest pain 08/2008   normal coronary angiogram  . Diabetes ([place])   . Hyperlipemia   . Hypertension   . 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 Sleep apnea    Past Surgical History:  Procedure Laterality Date  . ABDOMINAL HYSTERECTOMY    . ANKLE SURGERY  2006  . TONSILLECTOMY  1957   Social History   Socioeconomic History  . Marital status: Married    Spouse name: Not on file  . Number of children: Not on file  . Years of education: Not on file  . Highest education level: Not on file  Occupational History  . Not on file  Social Needs  . Financial resource strain: Not on file  . Food insecurity    Worry: Not on file    Inability: Not on file  . Transportation needs    Medical: Not on file    Non-medical: Not on file  Tobacco Use  . Smoking status: Never Smoker  . Smokeless tobacco: Never Used  Substance and Sexual Activity  . Alcohol use: Never    Frequency: Never   . Drug use: Never  . Sexual activity: Not on file  Lifestyle  . Physical activity    Days per week: Not on file    Minutes per session: Not on file  . Stress: Not on file  Relationships  . Social Herbalist on phone: Not on file    Gets together: Not on file    Attends religious service: Not on file    Active member of club or organization: Not on file    Attends meetings of clubs or organizations: Not on file    Relationship status: Not on file  Other Topics Concern  . Not on file  Social History Narrative  . Not on file   Outpatient Encounter Medications as of 01/20/2019  Medication Sig  . ACCU-CHEK GUIDE test strip USE TO TEST BLOOD SUGAR FOUR TIMES A DAY  . aspirin 81 MG tablet Take 81 mg by mouth daily.  . B-D UF III MINI PEN NEEDLES 31G X 5 MM MISC USE 1 NEEDLE AS DIRECTED  .  Calcium Citrate-Vitamin D (CALCIUM + D PO) Take by mouth.  . cholecalciferol (VITAMIN D) 1000 UNITS tablet Take 1,000 Units by mouth daily.  . Continuous Blood Gluc Sensor (FREESTYLE LIBRE 14 DAY SENSOR) MISC USE EVERY 14 DAYS  . Cranberry 400 MG TABS Take 4,200 mg by mouth daily.  . Fish Oil-Cholecalciferol (FISH OIL + D3) 1000-1000 MG-UNIT CAPS Take by mouth.  . gabapentin (NEURONTIN) 300 MG capsule Take 300 mg by mouth 2 (two) times daily.  . Insulin Glargine, 2 Unit Dial, (TOUJEO MAX SOLOSTAR) 300 UNIT/ML SOPN Inject 48 Units into the skin at bedtime.  . Insulin Lispro (HUMALOG KWIKPEN) 200 UNIT/ML SOPN Inject 10-16 Units into the skin 3 (three) times daily before meals.  . Insulin Pen Needle (BD PEN NEEDLE NANO U/F) 32G X 4 MM MISC 1 each by Does not apply route 4 (four) times daily.  . IRON PO Take 26 mg by mouth 2 (two) times daily.  Marland Kitchen lisinopril (PRINIVIL,ZESTRIL) 10 MG tablet Take 10 mg by mouth at bedtime.  . pravastatin (PRAVACHOL) 10 MG tablet Take 10 mg by mouth daily.  . verapamil (COVERA HS) 240 MG (CO) 24 hr tablet Take 240 mg by mouth at bedtime.  . vitamin B-12  (CYANOCOBALAMIN) 1000 MCG tablet Take 1,000 mcg by mouth daily.   No facility-administered encounter medications on file as of 01/20/2019.    ALLERGIES: No Known Allergies VACCINATION STATUS:  There is no immunization history on file for this patient.  Diabetes She presents for her follow-up diabetic visit. She has type 2 diabetes mellitus. Onset time: He was diagnosed at approximate age of 28 years. Her disease course has been improving. There are no hypoglycemic associated symptoms. Pertinent negatives for hypoglycemia include no confusion, headaches, pallor or seizures. There are no diabetic associated symptoms. Pertinent negatives for diabetes include no chest pain, no polydipsia, no polyphagia and no polyuria. There are no hypoglycemic complications. 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 She was found to have severe hyperglycemia in to 400s, this necessitated increasing her Toujeo to 50 units daily. ). Diabetic complications include retinopathy. Risk factors for coronary artery disease include diabetes mellitus, dyslipidemia, hypertension and tobacco exposure. Current diabetic treatment includes insulin injections and oral agent (dual therapy). Her weight is fluctuating minimally. She is following a generally unhealthy diet. She has had a previous visit with a dietitian. She rarely participates in exercise. Her home blood glucose trend is fluctuating minimally. Her breakfast blood glucose range is generally 140-180 mg/dl. Her lunch blood glucose range is generally 140-180 mg/dl. Her dinner blood glucose range is generally 140-180 mg/dl. Her bedtime blood glucose range is generally 140-180 mg/dl. Her overall blood glucose range is 140-180 mg/dl. An ACE inhibitor/angiotensin II receptor blocker is being taken. Eye exam is current.  Hyperlipidemia This is a chronic problem. The current episode started more than 1 year ago. The  problem is controlled. Pertinent negatives include no chest pain, myalgias or shortness of breath. Current antihyperlipidemic treatment includes statins. Risk factors for coronary artery disease include diabetes mellitus, obesity, a sedentary lifestyle and post-menopausal.  Hypertension This is a chronic problem. The current episode started more than 1 year ago. The problem is controlled. Pertinent negatives include no chest pain, headaches, palpitations or shortness of breath. Risk factors for coronary artery disease include diabetes mellitus and dyslipidemia. Past treatments include calcium channel blockers. Hypertensive end-organ damage includes retinopathy.    Review of systems: Limited as above.  Objective:  There were no vitals taken for this visit.  Wt Readings from Last 3 Encounters:  05/07/18 190 lb (86.2 kg)  03/19/18 189 lb (85.7 kg)  12/17/17 184 lb (83.5 kg)     Results for orders placed or performed in visit on [id number]  Basic metabolic panel  Result Value Ref Range   BUN 20 4 - 21   Creatinine 0.8 0.5 - 1.1   Potassium 4.7 3.4 - 5.3   Sodium 141 137 - 147  Hepatic function panel  Result Value Ref Range   ALT 18 7 - 35   AST 30 13 - 35   Bilirubin, Total 0.4   Hemoglobin A1c  Result Value Ref Range   Hemoglobin A1C 7.3    Lipid Panel     Component Value Date/Time   CHOL 128 01/24/2016   TRIG 88 01/24/2016   HDL 43 01/24/2016   LDLCALC 67 01/24/2016    Assessment & Plan:   1. Uncontrolled type 2 diabetes mellitus with complication, with long-term current use of insulin ([place])  -She reports significantly better and near target glycemic profile with A1c of 7.3% improving from 7.7% during her last visit.   - She does not have documented or reported major hypoglycemia.  Patient remains at a high risk for more acute and chronic complications of diabetes which include CAD, CVA, CKD, retinopathy, and neuropathy. These are all discussed in detail with the  patient. Glucose logs and insulin administration records pertaining to this visit,  to be scanned into patient's records.  Recent labs reviewed.   - I have re-counseled the patient on diet management  by adopting a carbohydrate restricted / protein rich  Diet.  - she  admits there is a room for improvement in her diet and drink choices. -  Suggestion is made for her to avoid simple carbohydrates  from her diet including Cakes, Sweet Desserts / Pastries, Ice Cream, Soda (diet and regular), Sweet Tea, Candies, Chips, Cookies, Sweet Pastries,  Store Bought Juices, Alcohol in Excess of  1-2 drinks a day, Artificial Sweeteners, Coffee Creamer, and "Sugar-free" Products. This will help patient to have stable blood glucose profile and potentially avoid unintended weight gain.   - Patient is advised to stick to a routine mealtimes to eat 3 meals  a day and avoid unnecessary snacks ( to snack only to correct hypoglycemia).   - I have approached patient with the following individualized plan to manage diabetes and patient agrees.  - She will continue to need intensive treatment with basal/bolus insulin.   -She is advised to continue Toujeo 48 units nightly, continue Humalog 10  units 3 times a day before meals for pre-meal glucose above 90 mg/dL,  associated with strict documenting blood glucose at least 4 times a day-before meals and at bedtime.   -She is wearing the Libre CGM device-advised to continue to wear it at all times.   -Patient is encouraged to call clinic for blood glucose levels less than 70 or above 200 mg /dl x 3 readings in a row.  -  She will stay off of metformin for now.   - She is not a good candidate for incretin therapy, SGLT2 inhibitors . Her anti-GAD and anti-islet cell antibodies are negative.  2) BP/HTN: she is advised to home monitor blood pressure and report if > 140/90 on 2 separate readings.   She is advised to continue her current blood pressure medications including  verapamil 240 mg daily.   3)  Lipids/HPL: Her lipid panel is controlled with LDL of 67.  She will continue to benefit from statin therapy, advised to continue pravastatin 10 mg p.o. nightly.    4)  Weight/Diet: CDE consult in progress, exercise, and carbohydrates information provided. -Given her significant weight gain recently, I offered her thyroid function tests.  These tests are showing normal thyroid function.  5) Chronic Care/Health Maintenance:  -Patient is on ACEI/ARB and Statin medications and encouraged to continue to follow up with Ophthalmology, Podiatrist at least yearly or according to recommendations, and advised to  stay away from smoking. I have recommended yearly flu vaccine and pneumonia vaccination at least every 5 years; moderate intensity exercise for up to 150 minutes weekly; and  sleep for at least 7 hours a day.  - I advised patient to maintain close follow up with her PCP for primary care needs.  - Patient Care Time Today:  25 min, of which >50% was spent in  counseling and the rest reviewing her  current and  previous labs/studies, previous treatments, her blood glucose readings, and medications' doses and developing a plan for long-term care based on the latest recommendations for standards of care.   Taylor Wilkins participated in the discussions, expressed understanding, and voiced agreement with the above plans.  All questions were answered to her satisfaction. she is encouraged to contact clinic should she have any questions or concerns prior to her return visit.  Follow up plan: -Return in about 4 months (around 05/22/2019) for Bring Meter and Logs- A1c in Office, Include 6 log sheets.  Glade Lloyd, MD Phone: [phone]  Fax: [phone]  This note was partially dictated with voice recognition software. Similar sounding words can be transcribed inadequately or may not  be corrected upon review.  01/20/2019, 5:35 PM

## 2019-03-09 IMAGING — NM NM MISC PROCEDURE
9 series · 54 of 54 positions shown · non-contrast
Comparison: none

[Series 1: wbr rest · 6.40mm/px · 6 of 64 frames shown]
[frame 6/64]
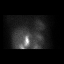
[frame 16/64]
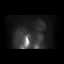
[frame 27/64]
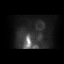
[frame 38/64]
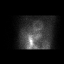
[frame 48/64]
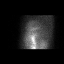
[frame 59/64]
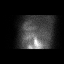

[Series 1: wbr_r-proj_st wbr rest · 6.40mm/px · 6 of 64 frames shown]
[frame 6/64]
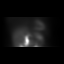
[frame 16/64]
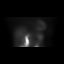
[frame 27/64]
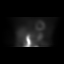
[frame 38/64]
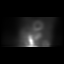
[frame 48/64]
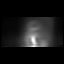
[frame 59/64]
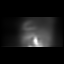

[Series 1: rest sax · 6.4mm · 6.40mm/px · 6 of 19 frames shown]
[frame 2/19]
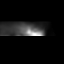
[frame 5/19]
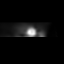
[frame 8/19]
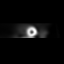
[frame 11/19]
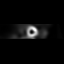
[frame 14/19]
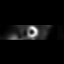
[frame 18/19]
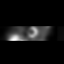

[Series 2: stress sax · 6.4mm · 6.40mm/px · 6 of 22 frames shown]
[frame 2/22]
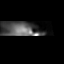
[frame 6/22]
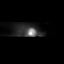
[frame 10/22]
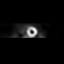
[frame 13/22]
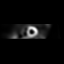
[frame 17/22]
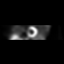
[frame 21/22]
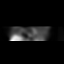

[Series 2: stress sax gs · 6.4mm · 6.40mm/px · 6 of 176 frames shown]
[frame 15/176]
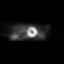
[frame 44/176]
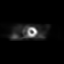
[frame 74/176]
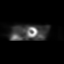
[frame 103/176]
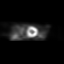
[frame 132/176]
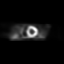
[frame 162/176]
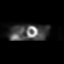

[Series 2: wbr stress-gsp · 6.40mm/px · 6 of 512 frames shown]
[frame 43/512]
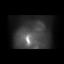
[frame 128/512]
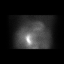
[frame 214/512]
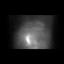
[frame 299/512]
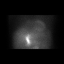
[frame 384/512]
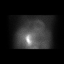
[frame 470/512]
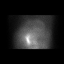

[Series 2: wbr_s-proj_st wbr stress-gsp · 6.40mm/px · 6 of 512 frames shown]
[frame 43/512]
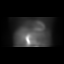
[frame 128/512]
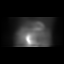
[frame 214/512]
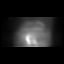
[frame 299/512]
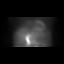
[frame 384/512]
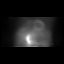
[frame 470/512]
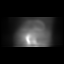

[Series 3: wbr stress-sum-em · 6.40mm/px · 6 of 64 frames shown]
[frame 6/64]
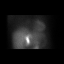
[frame 16/64]
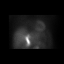
[frame 27/64]
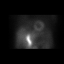
[frame 38/64]
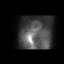
[frame 48/64]
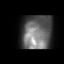
[frame 59/64]
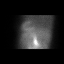

[Series 3: wbr_s-proj_st wbr stress-sum-em · 6.40mm/px · 6 of 64 frames shown]
[frame 6/64]
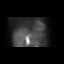
[frame 16/64]
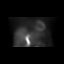
[frame 27/64]
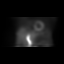
[frame 38/64]
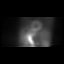
[frame 48/64]
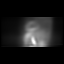
[frame 59/64]
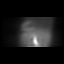

[54 of 54 positions shown; findings below may reference images not displayed]

Canned report from images found in remote index.

Refer to host system for actual result text.

## 2019-03-31 ENCOUNTER — Other Ambulatory Visit: Payer: Self-pay | Admitting: "Endocrinology

## 2019-04-21 ENCOUNTER — Encounter (INDEPENDENT_AMBULATORY_CARE_PROVIDER_SITE_OTHER): Payer: BC Managed Care – PPO | Admitting: Ophthalmology

## 2019-04-21 DIAGNOSIS — H35033 Hypertensive retinopathy, bilateral: Secondary | ICD-10-CM | POA: Diagnosis not present

## 2019-04-21 DIAGNOSIS — E11319 Type 2 diabetes mellitus with unspecified diabetic retinopathy without macular edema: Secondary | ICD-10-CM

## 2019-04-21 DIAGNOSIS — I10 Essential (primary) hypertension: Secondary | ICD-10-CM

## 2019-04-21 DIAGNOSIS — H2513 Age-related nuclear cataract, bilateral: Secondary | ICD-10-CM

## 2019-04-21 DIAGNOSIS — E113393 Type 2 diabetes mellitus with moderate nonproliferative diabetic retinopathy without macular edema, bilateral: Secondary | ICD-10-CM

## 2019-04-21 DIAGNOSIS — H43813 Vitreous degeneration, bilateral: Secondary | ICD-10-CM

## 2019-05-27 ENCOUNTER — Ambulatory Visit (INDEPENDENT_AMBULATORY_CARE_PROVIDER_SITE_OTHER): Payer: BC Managed Care – PPO | Admitting: "Endocrinology

## 2019-05-27 ENCOUNTER — Encounter: Payer: Self-pay | Admitting: "Endocrinology

## 2019-05-27 ENCOUNTER — Other Ambulatory Visit: Payer: Self-pay

## 2019-05-27 VITALS — BP 159/81 | HR 79 | Wt 196.6 lb

## 2019-05-27 DIAGNOSIS — E118 Type 2 diabetes mellitus with unspecified complications: Secondary | ICD-10-CM

## 2019-05-27 DIAGNOSIS — E782 Mixed hyperlipidemia: Secondary | ICD-10-CM

## 2019-05-27 DIAGNOSIS — I10 Essential (primary) hypertension: Secondary | ICD-10-CM

## 2019-05-27 DIAGNOSIS — IMO0002 Reserved for concepts with insufficient information to code with codable children: Secondary | ICD-10-CM

## 2019-05-27 DIAGNOSIS — E1165 Type 2 diabetes mellitus with hyperglycemia: Secondary | ICD-10-CM | POA: Diagnosis not present

## 2019-05-27 DIAGNOSIS — Z794 Long term (current) use of insulin: Secondary | ICD-10-CM

## 2019-05-27 LAB — POCT GLYCOSYLATED HEMOGLOBIN (HGB A1C): Hemoglobin A1C: 7.6 % — AB (ref 4.0–5.6)

## 2019-05-27 MED ORDER — TOUJEO MAX SOLOSTAR 300 UNIT/ML ~~LOC~~ SOPN: 52.0000 [IU] | PEN_INJECTOR | Freq: Every day | SUBCUTANEOUS | 2 refills | Status: DC

## 2019-05-27 NOTE — Patient Instructions (Addendum)

## 2019-05-27 NOTE — Progress Notes (Signed)
05/27/2019   Endocrinology follow-up note   Subjective:    Patient ID: Taylor Wilkins, female    DOB: 1946-01-25, PCP Tillman Abide, FNP   Past Medical History:  Diagnosis Date  . Cancer ([place])    Basal cell on face  . Chest pain 08/2008   normal coronary angiogram  . Diabetes ([place])   . Hyperlipemia   . Hypertension   . 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 Sleep apnea    Past Surgical History:  Procedure Laterality Date  . ABDOMINAL HYSTERECTOMY    . ANKLE SURGERY  2006  . TONSILLECTOMY  1957   Social History   Socioeconomic History  . Marital status: Married    Spouse name: Not on file  . Number of children: Not on file  . Years of education: Not on file  . Highest education level: Not on file  Occupational History  . Not on file  Tobacco Use  . Smoking status: Never Smoker  . Smokeless tobacco: Never Used  Substance and Sexual Activity  . Alcohol use: Never  . Drug use: Never  . Sexual activity: Not on file  Other Topics Concern  . Not on file  Social History Narrative  . Not on file   Social Determinants of Health   Financial Resource Strain:   . Difficulty of Paying Living Expenses: Not on file  Food Insecurity:   . Worried About Charity fundraiser in the Last Year: Not on file  . Ran Out of Food in the Last Year: Not on file  Transportation Needs:   . Lack of Transportation (Medical): Not on file  . Lack of Transportation (Non-Medical): Not on file  Physical Activity:   . Days of Exercise per Week: Not on file  . Minutes of Exercise per Session: Not on file  Stress:   . Feeling of Stress : Not on file  Social Connections:   . Frequency of Communication with Friends and Family: Not on file  . Frequency of Social Gatherings with Friends and Family: Not on file  . Attends Religious Services: Not on file  . Active Member of Clubs or Organizations: Not on file  . Attends Archivist Meetings: Not on file  . Marital Status: Not on file    Outpatient Encounter Medications as of 05/27/2019  Medication Sig  . ACCU-CHEK GUIDE test strip USE TO TEST BLOOD SUGAR FOUR TIMES A DAY  . aspirin 81 MG tablet Take 81 mg by mouth daily.  . B-D UF III MINI PEN NEEDLES 31G X 5 MM MISC USE 1 NEEDLE AS DIRECTED  . Calcium Citrate-Vitamin D (CALCIUM + D PO) Take by mouth.  . cholecalciferol (VITAMIN D) 1000 UNITS tablet Take 1,000 Units by mouth daily.  . Continuous Blood Gluc Sensor (FREESTYLE LIBRE 14 DAY SENSOR) MISC USE EVERY 14 DAYS  . Cranberry 400 MG TABS Take 4,200 mg by mouth daily.  . Fish Oil-Cholecalciferol (FISH OIL + D3) 1000-1000 MG-UNIT CAPS Take by mouth.  . gabapentin (NEURONTIN) 300 MG capsule Take 300 mg by mouth 2 (two) times daily.  . Insulin Glargine, 2 Unit Dial, (TOUJEO MAX SOLOSTAR) 300 UNIT/ML SOPN Inject 52 Units into the skin at bedtime.  . Insulin Lispro (HUMALOG KWIKPEN) 200 UNIT/ML SOPN Inject 10-16 Units into the skin 3 (three) times daily before meals.  . Insulin Pen Needle (BD PEN NEEDLE NANO U/F) 32G X 4 MM MISC 1 each by Does not apply route 4 (  four) times daily.  . IRON PO Take 26 mg by mouth 2 (two) times daily.  Marland Kitchen lisinopril (PRINIVIL,ZESTRIL) 10 MG tablet Take 20 mg by mouth daily with breakfast.  . pravastatin (PRAVACHOL) 10 MG tablet Take 10 mg by mouth daily.  . verapamil (COVERA HS) 240 MG (CO) 24 hr tablet Take 240 mg by mouth at bedtime.  . vitamin B-12 (CYANOCOBALAMIN) 1000 MCG tablet Take 1,000 mcg by mouth daily.  . [DISCONTINUED] TOUJEO MAX SOLOSTAR 300 UNIT/ML SOPN INJECT 48 UNITS INTO THE SKIN AT BEDTIME   No facility-administered encounter medications on file as of 05/27/2019.   ALLERGIES: No Known Allergies VACCINATION STATUS:  There is no immunization history on file for this patient.  Diabetes She presents for her follow-up diabetic visit. She has type 2 diabetes mellitus. Onset time: He was diagnosed at approximate age of 46 years. Her disease course has been worsening. There are  no hypoglycemic associated symptoms. Pertinent negatives for hypoglycemia include no confusion, headaches, pallor or seizures. Pertinent negatives for diabetes include no chest pain, no polydipsia, no polyphagia and no polyuria. There are no hypoglycemic complications. 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 She was found to have severe hyperglycemia in to 400s, this necessitated increasing her Toujeo to 50 units daily. ). Diabetic complications include retinopathy. Risk factors for coronary artery disease include diabetes mellitus, dyslipidemia, hypertension and tobacco exposure. Current diabetic treatment includes insulin injections and oral agent (dual therapy). Her weight is increasing steadily. She is following a generally unhealthy diet. She has had a previous visit with a dietitian. She rarely participates in exercise. Her home blood glucose trend is fluctuating minimally. Her breakfast blood glucose range is generally 140-180 mg/dl. Her lunch blood glucose range is generally 140-180 mg/dl. Her dinner blood glucose range is generally 180-200 mg/dl. Her bedtime blood glucose range is generally 180-200 mg/dl. Her overall blood glucose range is 140-180 mg/dl. An ACE inhibitor/angiotensin II receptor blocker is being taken. Eye exam is current.  Hyperlipidemia This is a chronic problem. The current episode started more than 1 year ago. The problem is controlled. Pertinent negatives include no chest pain, myalgias or shortness of breath. Current antihyperlipidemic treatment includes statins. Risk factors for coronary artery disease include diabetes mellitus, obesity, a sedentary lifestyle and post-menopausal.  Hypertension This is a chronic problem. The current episode started more than 1 year ago. The problem is controlled. Pertinent negatives include no chest pain, headaches, palpitations or shortness of breath. Risk factors for coronary  artery disease include diabetes mellitus and dyslipidemia. Past treatments include calcium channel blockers. Hypertensive end-organ damage includes retinopathy.    Review of systems:  Constitutional: no weight gain/loss, no fatigue, no subjective hyperthermia, no subjective hypothermia Eyes: no blurry vision, no xerophthalmia ENT: no sore throat, no nodules palpated in throat, no dysphagia/odynophagia, no hoarseness Cardiovascular: no Chest Pain, no Shortness of Breath, no palpitations, no leg swelling Respiratory: no cough, no SOB Gastrointestinal: no Nausea/Vomiting/Diarhhea Musculoskeletal: no muscle/joint aches Skin: no rashes Neurological: no tremors, no numbness, no tingling, no dizziness Psychiatric: no depression, no anxiety  Objective:    BP (!) 159/81   Pulse 79   Wt 196 lb 9.6 oz (89.2 kg)   BMI 32.72 kg/m   Wt Readings from Last 3 Encounters:  05/27/19 196 lb 9.6 oz (89.2 kg)  05/07/18 190 lb (86.2 kg)  03/19/18 189 lb (85.7 kg)    Physical Exam- Limited  Constitutional:  Body mass index is  32.72 kg/m. , not in acute distress, normal state of mind Eyes:  EOMI, no exophthalmos Neck: Supple Respiratory: Adequate breathing efforts Musculoskeletal: no gross deformities, strength intact in all four extremities, no gross restriction of joint movements Skin:  no rashes, no hyperemia Neurological: no tremor with outstretched hands.   Results for orders placed or performed in visit on 05/27/19  HgB A1c  Result Value Ref Range   Hemoglobin A1C 7.6 (A) 4.0 - 5.6 %   HbA1c POC (<> result, manual entry)     HbA1c, POC (prediabetic range)     HbA1c, POC (controlled diabetic range)     Lipid Panel     Component Value Date/Time   CHOL 128 01/24/2016 0000   TRIG 88 01/24/2016 0000   HDL 43 01/24/2016 0000   LDLCALC 67 01/24/2016 0000    Assessment & Plan:   1. Uncontrolled type 2 diabetes mellitus with complication, with long-term current use of insulin  ([place])  -She presents with her CGM device and logs showing slightly above target profile.  She is her lunch and supper are close to each other.  Her point-of-care A1c is 7.6% increasing from 7.3%.  - She does not have documented or reported major hypoglycemia.  Patient remains at a high risk for more acute and chronic complications of diabetes which include CAD, CVA, CKD, retinopathy, and neuropathy. These are all discussed in detail with the patient. Glucose logs and insulin administration records pertaining to this visit,  to be scanned into patient's records.  Recent labs reviewed.   - I have re-counseled the patient on diet management  by adopting a carbohydrate restricted / protein rich  Diet.  - she  admits there is a room for improvement in her diet and drink choices. -  Suggestion is made for her to avoid simple carbohydrates  from her diet including Cakes, Sweet Desserts / Pastries, Ice Cream, Soda (diet and regular), Sweet Tea, Candies, Chips, Cookies, Sweet Pastries,  Store Bought Juices, Alcohol in Excess of  1-2 drinks a day, Artificial Sweeteners, Coffee Creamer, and "Sugar-free" Products. This will help patient to have stable blood glucose profile and potentially avoid unintended weight gain.   - Patient is advised to stick to a routine mealtimes to eat 3 meals  a day and avoid unnecessary snacks ( to snack only to correct hypoglycemia).   - I have approached patient with the following individualized plan to manage diabetes and patient agrees.  - She will continue to need intensive treatment with basal/bolus insulin.   -She is advised to increase Toujeo to 52 units nightly,  continue Humalog 10  units 3 times a day before meals for pre-meal glucose above 90 mg/dL,  associated with strict documenting blood glucose at least 4 times a day-before meals and at bedtime.   -She is wearing the Libre CGM device-advised to continue to wear it at all times.   -Patient is encouraged to call  clinic for blood glucose levels less than 70 or above 200 mg /dl x 3 readings in a row.  -  She will stay off of metformin for now.   - She is not a good candidate for incretin therapy, SGLT2 inhibitors . Her anti-GAD and anti-islet cell antibodies are negative.  2) BP/HTN: Her blood pressure is not controlled to target.  She is advised to increase her lisinopril to 20 mg p.o. daily at breakfast.  She also takes verapamil 240 mg p.o. daily.   3) Lipids/HPL: Her lipid  panel is controlled with LDL of 67.  She will continue to benefit from statin therapy, advised to continue pravastatin 10 mg p.o. nightly.    4)  Weight/Diet: CDE consult in progress, exercise, and carbohydrates information provided. -Given her significant weight gain recently, I offered her thyroid function tests.  These tests are showing normal thyroid function.  5) Chronic Care/Health Maintenance:  -Patient is on ACEI/ARB and Statin medications and encouraged to continue to follow up with Ophthalmology, Podiatrist at least yearly or according to recommendations, and advised to  stay away from smoking. I have recommended yearly flu vaccine and pneumonia vaccination at least every 5 years; moderate intensity exercise for up to 150 minutes weekly; and  sleep for at least 7 hours a day.  - I advised patient to maintain close follow up with her PCP for primary care needs.  - Time spent on this patient care encounter:  35 min, of which > 50% was spent in  counseling and the rest reviewing her blood glucose logs , discussing her hypoglycemia and hyperglycemia episodes, reviewing her current and  previous labs / studies  ( including abstraction from other facilities) and medications  doses and developing a  long term treatment plan and documenting her care.   Please refer to Patient Instructions for Blood Glucose Monitoring and Insulin/Medications Dosing Guide"  in media tab for additional information. Please  also refer to " Patient  Self Inventory" in the Media  tab for reviewed elements of pertinent patient history.  Taylor Wilkins participated in the discussions, expressed understanding, and voiced agreement with the above plans.  All questions were answered to her satisfaction. she is encouraged to contact clinic should she have any questions or concerns prior to her return visit.  Follow up plan: -Return in about 4 months (around 09/24/2019) for Bring Meter and Logs- A1c in Office, Follow up with Pre-visit Labs.  Glade Lloyd, MD Phone: [phone]  Fax: [phone]  This note was partially dictated with voice recognition software. Similar sounding words can be transcribed inadequately or may not  be corrected upon review.  05/27/2019, 1:33 PM

## 2019-07-12 ENCOUNTER — Other Ambulatory Visit: Payer: Self-pay | Admitting: "Endocrinology

## 2019-09-07 ENCOUNTER — Other Ambulatory Visit: Payer: Self-pay | Admitting: "Endocrinology

## 2019-09-07 DIAGNOSIS — IMO0002 Reserved for concepts with insufficient information to code with codable children: Secondary | ICD-10-CM

## 2019-09-07 DIAGNOSIS — E1165 Type 2 diabetes mellitus with hyperglycemia: Secondary | ICD-10-CM

## 2019-09-17 ENCOUNTER — Encounter: Payer: Self-pay | Admitting: Cardiovascular Disease

## 2019-09-17 ENCOUNTER — Ambulatory Visit (INDEPENDENT_AMBULATORY_CARE_PROVIDER_SITE_OTHER): Payer: BC Managed Care – PPO | Admitting: Cardiovascular Disease

## 2019-09-17 ENCOUNTER — Other Ambulatory Visit: Payer: Self-pay

## 2019-09-17 VITALS — BP 162/74 | HR 71 | Ht 61.5 in | Wt 193.0 lb

## 2019-09-17 DIAGNOSIS — I10 Essential (primary) hypertension: Secondary | ICD-10-CM | POA: Diagnosis not present

## 2019-09-17 DIAGNOSIS — E782 Mixed hyperlipidemia: Secondary | ICD-10-CM

## 2019-09-17 LAB — HEPATIC FUNCTION PANEL
ALT: 19 IU/L (ref 0–32)
AST: 21 IU/L (ref 0–40)
Albumin: 4.2 g/dL (ref 3.7–4.7)
Alkaline Phosphatase: 86 IU/L (ref 39–117)
Bilirubin Total: 0.4 mg/dL (ref 0.0–1.2)
Bilirubin, Direct: 0.1 mg/dL (ref 0.00–0.40)
Total Protein: 6.1 g/dL (ref 6.0–8.5)

## 2019-09-17 LAB — LIPID PANEL
Chol/HDL Ratio: 5.1 ratio — ABNORMAL HIGH (ref 0.0–4.4)
Cholesterol, Total: 231 mg/dL — ABNORMAL HIGH (ref 100–199)
HDL: 45 mg/dL (ref >39)
LDL Chol Calc (NIH): 152 mg/dL — ABNORMAL HIGH (ref 0–99)
Triglycerides: 185 mg/dL — ABNORMAL HIGH (ref 0–149)
VLDL Cholesterol Cal: 34 mg/dL (ref 5–40)

## 2019-09-17 NOTE — Progress Notes (Signed)
09/17/2019 Taylor Wilkins   1946/03/22  [id number]  Primary Physician Taylor Wilkins, Taylor Wilkins Primary Cardiologist: Taylor Harp MD Taylor Wilkins, [place]  HPI:  Taylor Wilkins is a 74 y.o.  married Caucasian female mother of 48, grandmother and 6 grandchildren referred by Taylor Harbour WinickerFNP in [place] to be reestablished our practice.I last saw her in the office  05/07/2018.She had previously seen Dr. Elisabeth Cara in our practice years ago and apparently had a normal cardiac catheterization after false positive GXT. She does work as a Teacher, early years/pre. She has cardiac risk factors notable for 2 hypertension and hyperlipidemia as well as diabetes. She does not smoke. Her mother did die of a myocardial infarction at age 11. She had new onset dyspnea on exertion over several months prior to her last office visit which prompted obtaining a 2-D echo and a Myoview stress test on 11/08/16 which were normal. Her symptoms have since resolved. She has had CLL for last 3 years and has recently finished chemotherapy for this.  This resulted in her beginning insulin and steroids which has apparently led to a 15 pound weight gain.  Since I saw her over a year ago she is remained stable.  Her CLL has remained stable.  She is very active, denies chest pain or shortness of breath.  Her bursitis has improved as well.  Current Meds  Medication Sig  . ACCU-CHEK GUIDE test strip USE TO TEST BLOOD SUGAR FOUR TIMES A DAY  . aspirin 81 MG tablet Take 81 mg by mouth daily.  . B-D UF III MINI PEN NEEDLES 31G X 5 MM MISC USE 1 NEEDLE AS DIRECTED  . Calcium Citrate-Vitamin D (CALCIUM + D PO) Take by mouth.  . cholecalciferol (VITAMIN D) 1000 UNITS tablet Take 1,000 Units by mouth daily.  . Continuous Blood Gluc Sensor (FREESTYLE LIBRE 14 DAY SENSOR) MISC USE ONE SENSOR EVERY 14 DAYS  . Cranberry 400 MG TABS Take 4,200 mg by mouth daily.  . Fish Oil-Cholecalciferol (FISH OIL + D3) 1000-1000 MG-UNIT CAPS Take by  mouth.  . gabapentin (NEURONTIN) 300 MG capsule Take 300 mg by mouth 2 (two) times daily.  Marland Kitchen HUMALOG KWIKPEN 200 UNIT/ML KwikPen INJECT 10-16 UNITS         SUBCUTANEOUSLY 3 TIMES A   DAY BEFORE MEALS  . Insulin Glargine, 2 Unit Dial, (TOUJEO MAX SOLOSTAR) 300 UNIT/ML SOPN Inject 52 Units into the skin at bedtime.  . Insulin Pen Needle (BD PEN NEEDLE NANO U/F) 32G X 4 MM MISC 1 each by Does not apply route 4 (four) times daily.  . IRON PO Take 26 mg by mouth 2 (two) times daily.  Marland Kitchen lisinopril (ZESTRIL) 20 MG tablet Take 20 mg by mouth daily with breakfast.   . pravastatin (PRAVACHOL) 10 MG tablet Take 10 mg by mouth daily.  . verapamil (COVERA HS) 240 MG (CO) 24 hr tablet Take 240 mg by mouth at bedtime.  . vitamin B-12 (CYANOCOBALAMIN) 1000 MCG tablet Take 1,000 mcg by mouth daily.     No Known Allergies  Social History   Socioeconomic History  . Marital status: Married    Spouse name: Not on file  . Number of children: Not on file  . Years of education: Not on file  . Highest education level: Not on file  Occupational History  . Not on file  Tobacco Use  . Smoking status: Never Smoker  . Smokeless tobacco: Never Used  Substance and Sexual  Activity  . Alcohol use: Never  . Drug use: Never  . Sexual activity: Not on file  Other Topics Concern  . Not on file  Social History Narrative  . Not on file   Social Determinants of Health   Financial Resource Strain:   . Difficulty of Paying Living Expenses:   Food Insecurity:   . Worried About Charity fundraiser in the Last Year:   . Arboriculturist in the Last Year:   Transportation Needs:   . Film/video editor (Medical):   Marland Kitchen Lack of Transportation (Non-Medical):   Physical Activity:   . Days of Exercise per Week:   . Minutes of Exercise per Session:   Stress:   . Feeling of Stress :   Social Connections:   . Frequency of Communication with Friends and Family:   . Frequency of Social Gatherings with Friends and  Family:   . Attends Religious Services:   . Active Member of Clubs or Organizations:   . Attends Archivist Meetings:   Marland Kitchen Marital Status:   Intimate Partner Violence:   . Fear of Current or Ex-Partner:   . Emotionally Abused:   Marland Kitchen Physically Abused:   . Sexually Abused:      Review of Systems: General: negative for chills, fever, night sweats or weight changes.  Cardiovascular: negative for chest pain, dyspnea on exertion, edema, orthopnea, palpitations, paroxysmal nocturnal dyspnea or shortness of breath Dermatological: negative for rash Respiratory: negative for cough or wheezing Urologic: negative for hematuria Abdominal: negative for nausea, vomiting, diarrhea, bright red blood per rectum, melena, or hematemesis Neurologic: negative for visual changes, syncope, or dizziness All other systems reviewed and are otherwise negative except as noted above.    Blood pressure (!) 162/74, pulse 71, height 5' 1.5" (1.562 m), weight 193 lb (87.5 kg), SpO2 97 %.  General appearance: alert and no distress Neck: no adenopathy, no carotid bruit, no JVD, supple, symmetrical, trachea midline and thyroid not enlarged, symmetric, no tenderness/mass/nodules Lungs: clear to auscultation bilaterally Heart: regular rate and rhythm, S1, S2 normal, no murmur, click, rub or gallop Extremities: extremities normal, atraumatic, no cyanosis or edema Pulses: 2+ and symmetric Skin: Skin color, texture, turgor normal. No rashes or lesions Neurologic: Alert and oriented X 3, normal strength and tone. Normal symmetric reflexes. Normal coordination and gait  EKG sinus rhythm at 71 without ST or T wave changes.  I personally reviewed this EKG.  ASSESSMENT AND PLAN:   Mixed hyperlipidemia History of hyperlipidemia on pravastatin.'s been over a year since her last lipid profile.  January 2020 her LDL was 109.  We will repeat a fasting lipid and liver profile this morning.  Essential hypertension,  benign History of essential hypertension with blood pressure measured today at 162/74.  She is on lisinopril and verapamil she is.  She takes her blood pressure at home and it usually runs in the 130/70 range.      Taylor Harp MD FACP,FACC,FAHA, [facility] 09/17/2019 8:25 AM

## 2019-09-17 NOTE — Assessment & Plan Note (Signed)
History of essential hypertension with blood pressure measured today at 162/74.  She is on lisinopril and verapamil she is.  She takes her blood pressure at home and it usually runs in the 130/70 range.

## 2019-09-17 NOTE — Patient Instructions (Signed)
Medication Instructions:  NO CHANGE *If you need a refill on your cardiac medications before your next appointment, please call your pharmacy*   Lab Work: Your physician recommends that you HAVE LAB WORK TODAY  If you have labs (blood work) drawn today and your tests are completely normal, you will receive your results only by: Marland Kitchen MyChart Message (if you have MyChart) OR . A paper copy in the mail If you have any lab test that is abnormal or we need to change your treatment, we will call you to review the results.   Follow-Up: At [facility], you and your health needs are our priority.  As part of our continuing mission to provide you with exceptional heart care, we have created designated Provider Care Teams.  These Care Teams include your primary Cardiologist (physician) and Advanced Practice Providers (APPs -  Physician Assistants and Nurse Practitioners) who all work together to provide you with the care you need, when you need it.  We recommend signing up for the patient portal called "MyChart".  Sign up information is provided on this After Visit Summary.  MyChart is used to connect with patients for Virtual Visits (Telemedicine).  Patients are able to view lab/test results, encounter notes, upcoming appointments, etc.  Non-urgent messages can be sent to your provider as well.   To learn more about what you can do with MyChart, go to [email or web address].    Your next appointment:   12 month(s)  The format for your next appointment:   Either In Person or Virtual  Provider:   You may see Quay Burow MD or one of the following Advanced Practice Providers on your designated Care Team:    Kerin Ransom, PA-C  Bug [place]uss[place], [place]  Coletta Memos, [place]

## 2019-09-17 NOTE — Assessment & Plan Note (Signed)
History of hyperlipidemia on pravastatin.'s been over a year since her last lipid profile.  January 2020 her LDL was 109.  We will repeat a fasting lipid and liver profile this morning.

## 2019-09-21 LAB — LIPID PANEL
Cholesterol: 221 — AB (ref 0–200)
HDL: 48 (ref 35–70)
LDL Cholesterol: 139
Triglycerides: 172 — AB (ref 40–160)

## 2019-09-21 LAB — BASIC METABOLIC PANEL
BUN: 25 — AB (ref 4–21)
Creatinine: 1 (ref 0.5–1.1)

## 2019-09-21 LAB — VITAMIN D 25 HYDROXY (VIT D DEFICIENCY, FRACTURES): Vit D, 25-Hydroxy: 47

## 2019-09-24 ENCOUNTER — Other Ambulatory Visit: Payer: Self-pay

## 2019-09-24 ENCOUNTER — Telehealth: Payer: Self-pay | Admitting: Cardiovascular Disease

## 2019-09-24 DIAGNOSIS — E782 Mixed hyperlipidemia: Secondary | ICD-10-CM

## 2019-09-24 NOTE — Telephone Encounter (Signed)
Called and spoke with pt to review lab results. Pt states that she had received a call from her PCP on Wednesday who also had these results. She states her PCP has switched her to lipitor 10mg  instead of the pravastatin. She would like Dr.Berry's opinion on which medication she should be on. She states she believes the reason her labs went up is because she had taken herself off the medication for 2-3 weeks due to cramping to see if it was the medication causing this. She states that 75% of the cramps went away but still has some.  Notified this message would be sent to Dr.Berry to review which medication she should be on. Notified that he has asked for repeat lipids in 2 months and that lab slips would be mailed to her. Pt verbalized understanding and had no other questions at this time. Will await our return call for medication decision.

## 2019-09-24 NOTE — Telephone Encounter (Signed)
° ° °  Pt is returning call from [place] regarding lab results

## 2019-09-27 NOTE — Telephone Encounter (Signed)
Remain on the Lipitor..  Repeat lipid liver profile in 2 months

## 2019-09-28 ENCOUNTER — Telehealth: Payer: Self-pay | Admitting: Cardiovascular Disease

## 2019-09-28 ENCOUNTER — Ambulatory Visit (INDEPENDENT_AMBULATORY_CARE_PROVIDER_SITE_OTHER): Payer: BC Managed Care – PPO | Admitting: "Endocrinology

## 2019-09-28 ENCOUNTER — Other Ambulatory Visit: Payer: Self-pay

## 2019-09-28 ENCOUNTER — Encounter: Payer: Self-pay | Admitting: "Endocrinology

## 2019-09-28 VITALS — BP 165/86 | HR 76 | Ht 61.5 in | Wt 196.6 lb

## 2019-09-28 DIAGNOSIS — E118 Type 2 diabetes mellitus with unspecified complications: Secondary | ICD-10-CM | POA: Diagnosis not present

## 2019-09-28 DIAGNOSIS — E782 Mixed hyperlipidemia: Secondary | ICD-10-CM

## 2019-09-28 DIAGNOSIS — Z794 Long term (current) use of insulin: Secondary | ICD-10-CM

## 2019-09-28 DIAGNOSIS — E1165 Type 2 diabetes mellitus with hyperglycemia: Secondary | ICD-10-CM

## 2019-09-28 DIAGNOSIS — I10 Essential (primary) hypertension: Secondary | ICD-10-CM | POA: Diagnosis not present

## 2019-09-28 DIAGNOSIS — IMO0002 Reserved for concepts with insufficient information to code with codable children: Secondary | ICD-10-CM

## 2019-09-28 DIAGNOSIS — Z79899 Other long term (current) drug therapy: Secondary | ICD-10-CM

## 2019-09-28 LAB — POCT GLYCOSYLATED HEMOGLOBIN (HGB A1C): Hemoglobin A1C: 7.8 % — AB (ref 4.0–5.6)

## 2019-09-28 MED ORDER — HUMALOG KWIKPEN 200 UNIT/ML ~~LOC~~ SOPN: 12.0000 [IU] | PEN_INJECTOR | Freq: Three times a day (TID) | SUBCUTANEOUS | 2 refills | Status: DC

## 2019-09-28 NOTE — Patient Instructions (Signed)

## 2019-09-28 NOTE — Telephone Encounter (Signed)
[id number] 5/18 

## 2019-09-28 NOTE — Telephone Encounter (Signed)
New Message    Pt is returning call     Call transferred to [facility] Taylor June

## 2019-09-28 NOTE — Progress Notes (Signed)
09/28/2019   Endocrinology follow-up note   Subjective:    Patient ID: Taylor Wilkins, female    DOB: 05-05-1946, PCP Tillman Abide, FNP   Past Medical History:  Diagnosis Date  . Cancer ([place])    Basal cell on face  . Chest pain 08/2008   normal coronary angiogram  . Diabetes ([place])   . Hyperlipemia   . Hypertension   . 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 Sleep apnea    Past Surgical History:  Procedure Laterality Date  . ABDOMINAL HYSTERECTOMY    . ANKLE SURGERY  2006  . TONSILLECTOMY  1957   Social History   Socioeconomic History  . Marital status: Married    Spouse name: Not on file  . Number of children: Not on file  . Years of education: Not on file  . Highest education level: Not on file  Occupational History  . Not on file  Tobacco Use  . Smoking status: Never Smoker  . Smokeless tobacco: Never Used  Substance and Sexual Activity  . Alcohol use: Never  . Drug use: Never  . Sexual activity: Not on file  Other Topics Concern  . Not on file  Social History Narrative  . Not on file   Social Determinants of Health   Financial Resource Strain:   . Difficulty of Paying Living Expenses:   Food Insecurity:   . Worried About Charity fundraiser in the Last Year:   . Arboriculturist in the Last Year:   Transportation Needs:   . Film/video editor (Medical):   Marland Kitchen Lack of Transportation (Non-Medical):   Physical Activity:   . Days of Exercise per Week:   . Minutes of Exercise per Session:   Stress:   . Feeling of Stress :   Social Connections:   . Frequency of Communication with Friends and Family:   . Frequency of Social Gatherings with Friends and Family:   . Attends Religious Services:   . Active Member of Clubs or Organizations:   . Attends Archivist Meetings:   Marland Kitchen Marital Status:    Outpatient Encounter Medications as of 09/28/2019  Medication Sig  . aspirin 81 MG tablet Take 81 mg by mouth daily.  Marland Kitchen atorvastatin (LIPITOR) 10 MG  tablet Take 10 mg by mouth daily.  . B-D UF III MINI PEN NEEDLES 31G X 5 MM MISC USE 1 NEEDLE AS DIRECTED  . Calcium Citrate-Vitamin D (CALCIUM + D PO) Take by mouth.  . cholecalciferol (VITAMIN D) 1000 UNITS tablet Take 1,000 Units by mouth daily.  . Continuous Blood Gluc Sensor (FREESTYLE LIBRE 14 DAY SENSOR) MISC USE ONE SENSOR EVERY 14 DAYS  . Cranberry 400 MG TABS Take 4,200 mg by mouth daily.  . Fish Oil-Cholecalciferol (FISH OIL + D3) 1000-1000 MG-UNIT CAPS Take by mouth.  . gabapentin (NEURONTIN) 300 MG capsule Take 300 mg by mouth 2 (two) times daily.  . Insulin Glargine, 2 Unit Dial, (TOUJEO MAX SOLOSTAR) 300 UNIT/ML SOPN Inject 52 Units into the skin at bedtime.  . insulin lispro (HUMALOG KWIKPEN) 200 UNIT/ML KwikPen Inject 12-15 Units into the skin 3 (three) times daily before meals.  . Insulin Pen Needle (BD PEN NEEDLE NANO U/F) 32G X 4 MM MISC 1 each by Does not apply route 4 (four) times daily.  . IRON PO Take 26 mg by mouth 2 (two) times daily.  Marland Kitchen lisinopril (ZESTRIL) 20 MG tablet Take 20 mg by mouth  daily with breakfast.   . verapamil (COVERA HS) 240 MG (CO) 24 hr tablet Take 240 mg by mouth at bedtime.  . vitamin B-12 (CYANOCOBALAMIN) 1000 MCG tablet Take 1,000 mcg by mouth daily.  . [DISCONTINUED] ACCU-CHEK GUIDE test strip USE TO TEST BLOOD SUGAR FOUR TIMES A DAY  . [DISCONTINUED] HUMALOG KWIKPEN 200 UNIT/ML KwikPen INJECT 10-16 UNITS         SUBCUTANEOUSLY 3 TIMES A   DAY BEFORE MEALS  . [DISCONTINUED] pravastatin (PRAVACHOL) 10 MG tablet Take 10 mg by mouth daily.   No facility-administered encounter medications on file as of 09/28/2019.   ALLERGIES: No Known Allergies VACCINATION STATUS:  There is no immunization history on file for this patient.  Diabetes She presents for her follow-up diabetic visit. She has type 2 diabetes mellitus. Onset time: He was diagnosed at approximate age of 73 years. Her disease course has been worsening. There are no hypoglycemic  associated symptoms. Pertinent negatives for hypoglycemia include no confusion, headaches, pallor or seizures. Pertinent negatives for diabetes include no chest pain, no polydipsia, no polyphagia and no polyuria. There are no hypoglycemic complications. 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 She was found to have severe hyperglycemia in to 400s, this necessitated increasing her Toujeo to 50 units daily. ). Diabetic complications include retinopathy. Risk factors for coronary artery disease include diabetes mellitus, dyslipidemia, hypertension and tobacco exposure. Current diabetic treatment includes insulin injections and oral agent (dual therapy). Her weight is fluctuating minimally. She is following a generally unhealthy diet. She has had a previous visit with a dietitian. She rarely participates in exercise. Her home blood glucose trend is increasing steadily. Her breakfast blood glucose range is generally 140-180 mg/dl. Her lunch blood glucose range is generally 180-200 mg/dl. Her dinner blood glucose range is generally 180-200 mg/dl. Her bedtime blood glucose range is generally 180-200 mg/dl. Her overall blood glucose range is 180-200 mg/dl. (She presents with her CGM device showing average blood glucose of 168.  She has 55% time range, 39% above range.  She has 6% mild hypoglycemia.) An ACE inhibitor/angiotensin II receptor blocker is being taken. Eye exam is current.  Hyperlipidemia This is a chronic problem. The current episode started more than 1 year ago. The problem is controlled. Pertinent negatives include no chest pain, myalgias or shortness of breath. Current antihyperlipidemic treatment includes statins. Risk factors for coronary artery disease include diabetes mellitus, obesity, a sedentary lifestyle and post-menopausal.  Hypertension This is a chronic problem. The current episode started more than 1 year ago. The problem is  controlled. Pertinent negatives include no chest pain, headaches, palpitations or shortness of breath. Risk factors for coronary artery disease include diabetes mellitus and dyslipidemia. Past treatments include calcium channel blockers. Hypertensive end-organ damage includes retinopathy.     Review of systems  Constitutional: + Minimally fluctuating body weight,  current  Body mass index is 36.55 kg/m. , no fatigue, no subjective hyperthermia, no subjective hypothermia Eyes: no blurry vision, no xerophthalmia ENT: no sore throat, no nodules palpated in throat, no dysphagia/odynophagia, no hoarseness Cardiovascular: no Chest Pain, no Shortness of Breath, no palpitations, no leg swelling Respiratory: no cough, no shortness of breath Gastrointestinal: no Nausea/Vomiting/Diarhhea Musculoskeletal: no muscle/joint aches Skin: no rashes, no hyperemia Neurological: no tremors, no numbness, no tingling, no dizziness Psychiatric: no depression, no anxiety   Objective:    BP (!) 165/86   Pulse 76   Ht 5' 1.5" (1.562 m)  Wt 196 lb 9.6 oz (89.2 kg)   BMI 36.55 kg/m   Wt Readings from Last 3 Encounters:  09/28/19 196 lb 9.6 oz (89.2 kg)  09/17/19 193 lb (87.5 kg)  05/27/19 196 lb 9.6 oz (89.2 kg)      Physical Exam- Limited  Constitutional:  Body mass index is 36.55 kg/m. , not in acute distress, normal state of mind Eyes:  EOMI, no exophthalmos Neck: Supple Thyroid: No gross goiter Respiratory: Adequate breathing efforts Musculoskeletal: no gross deformities, strength intact in all four extremities, no gross restriction of joint movements Skin:  no rashes, no hyperemia Neurological: no tremor with outstretched hands,    Results for orders placed or performed in visit on 09/28/19  VITAMIN D 25 Hydroxy (Vit-D Deficiency, Fractures)  Result Value Ref Range   Vit D, 25-Hydroxy 47   Basic metabolic panel  Result Value Ref Range   BUN 25 (A) 4 - 21   Creatinine 1.0 0.5 - 1.1   Lipid panel  Result Value Ref Range   Triglycerides 172 (A) 40 - 160   Cholesterol 221 (A) 0 - 200   HDL 48 35 - 70   LDL Cholesterol 139   HgB A1c  Result Value Ref Range   Hemoglobin A1C 7.8 (A) 4.0 - 5.6 %   HbA1c POC (<> result, manual entry)     HbA1c, POC (prediabetic range)     HbA1c, POC (controlled diabetic range)     Lipid Panel     Component Value Date/Time   CHOL 221 (A) 09/21/2019 0000   CHOL 231 (H) 09/17/2019 0834   TRIG 172 (A) 09/21/2019 0000   HDL 48 09/21/2019 0000   HDL 45 09/17/2019 0834   CHOLHDL 5.1 (H) 09/17/2019 0834   LDLCALC 139 09/21/2019 0000   LDLCALC 152 (H) 09/17/2019 0834   Recent Results (from the past 2160 hour(s))  Lipid panel     Status: Abnormal   Collection Time: 09/17/19  8:34 AM  Result Value Ref Range   Cholesterol, Total 231 (H) 100 - 199 mg/dL   Triglycerides 185 (H) 0 - 149 mg/dL   HDL 45 >39 mg/dL   VLDL Cholesterol Cal 34 5 - 40 mg/dL   LDL Chol Calc (NIH) 152 (H) 0 - 99 mg/dL   Chol/HDL Ratio 5.1 (H) 0.0 - 4.4 ratio    Comment:                                   T. Chol/HDL Ratio                                             Men  Women                               1/2 Avg.Risk  3.4    3.3                                   Avg.Risk  5.0    4.4  2X Avg.Risk  9.6    7.1                                3X Avg.Risk 23.4   11.0   Hepatic function panel     Status: None   Collection Time: 09/17/19  8:34 AM  Result Value Ref Range   Total Protein 6.1 6.0 - 8.5 g/dL   Albumin 4.2 3.7 - 4.7 g/dL   Bilirubin Total 0.4 0.0 - 1.2 mg/dL   Bilirubin, Direct 0.10 0.00 - 0.40 mg/dL   Alkaline Phosphatase 86 39 - 117 IU/L   AST 21 0 - 40 IU/L   ALT 19 0 - 32 IU/L  VITAMIN D 25 Hydroxy (Vit-D Deficiency, Fractures)     Status: None   Collection Time: 09/21/19 12:00 AM  Result Value Ref Range   Vit D, 25-Hydroxy 47   Basic metabolic panel     Status: Abnormal   Collection Time: 09/21/19 12:00 AM  Result  Value Ref Range   BUN 25 (A) 4 - 21   Creatinine 1.0 0.5 - 1.1  Lipid panel     Status: Abnormal   Collection Time: 09/21/19 12:00 AM  Result Value Ref Range   Triglycerides 172 (A) 40 - 160   Cholesterol 221 (A) 0 - 200   HDL 48 35 - 70   LDL Cholesterol 139   HgB A1c     Status: Abnormal   Collection Time: 09/28/19 10:09 AM  Result Value Ref Range   Hemoglobin A1C 7.8 (A) 4.0 - 5.6 %   HbA1c POC (<> result, manual entry)     HbA1c, POC (prediabetic range)     HbA1c, POC (controlled diabetic range)      Assessment & Plan:   1. Uncontrolled type 2 diabetes mellitus with complication.  She presents with her CGM device showing average blood glucose of 168.  She has 55% time range, 39% above range.  She has 6% mild hypoglycemia.  Her point-of-care A1c is 7.8%.   Patient remains at a high risk for more acute and chronic complications of diabetes which include CAD, CVA, CKD, retinopathy, and neuropathy. These are all discussed in detail with the patient. Glucose logs and insulin administration records pertaining to this visit,  to be scanned into patient's records.  Recent labs reviewed.   - I have re-counseled the patient on diet management  by adopting a carbohydrate restricted / protein rich  Diet.  - she  admits there is a room for improvement in her diet and drink choices. -  Suggestion is made for her to avoid simple carbohydrates  from her diet including Cakes, Sweet Desserts / Pastries, Ice Cream, Soda (diet and regular), Sweet Tea, Candies, Chips, Cookies, Sweet Pastries,  Store Bought Juices, Alcohol in Excess of  1-2 drinks a day, Artificial Sweeteners, Coffee Creamer, and "Sugar-free" Products. This will help patient to have stable blood glucose profile and potentially avoid unintended weight gain.   - Patient is advised to stick to a routine mealtimes to eat 3 meals  a day and avoid unnecessary snacks ( to snack only to correct hypoglycemia).   - I have approached  patient with the following individualized plan to manage diabetes and patient agrees.  - She will continue to need intensive treatment with basal/bolus insulin in order for her to maintain control of diabetes to target.    Continue to need intensive  treatment with basal/bolus insulin.   -She is advised to continue Toujeo 52 units nightly, increase Humalog to 12  units 3 times a day before meals for pre-meal glucose above 90 mg/dL,  associated with strict documenting blood glucose at least 4 times a day-before meals and at bedtime.   -She is wearing the Libre CGM device-advised to continue to wear it at all times.   -Patient is encouraged to call clinic for blood glucose levels less than 70 or above 200 mg /dl x 3 readings in a row.  -  She will stay off of metformin for now.   - She is not a good candidate for incretin therapy, SGLT2 inhibitors . Her anti-GAD and anti-islet cell antibodies are negative.  2) BP/HTN: Her blood pressure is not controlled to target.  She is advised to increase her lisinopril to 20 mg p.o. daily at breakfast.  She also takes verapamil 240 mg p.o. daily.   3) Lipids/HPL: Her lipid panel showed loss of control of LDL at 139, because patient stopped her pravastatin since last visit.  She was recently initiated on atorvastatin 10 mg p.o. nightly.  She is advised to continue.     4)  Weight/Diet: Her BMI 36.5-complicating her diabetes care.  She is a candidate for modest weight loss.  CDE consult in progress, exercise, and carbohydrates information provided. -Her thyroid function test is within normal limits.      5) Chronic Care/Health Maintenance:  -Patient is on ACEI/ARB and Statin medications and encouraged to continue to follow up with Ophthalmology, Podiatrist at least yearly or according to recommendations, and advised to  stay away from smoking. I have recommended yearly flu vaccine and pneumonia vaccination at least every 5 years; moderate intensity  exercise for up to 150 minutes weekly; and  sleep for at least 7 hours a day.  - I advised patient to maintain close follow up with her PCP for primary care needs.  - Time spent on this patient care encounter:  35 min, of which > 50% was spent in  counseling and the rest reviewing her blood glucose logs , discussing her hypoglycemia and hyperglycemia episodes, reviewing her current and  previous labs / studies  ( including abstraction from other facilities) and medications  doses and developing a  long term treatment plan and documenting her care.   Please refer to Patient Instructions for Blood Glucose Monitoring and Insulin/Medications Dosing Guide"  in media tab for additional information. Please  also refer to " Patient Self Inventory" in the Media  tab for reviewed elements of pertinent patient history.  Taylor Wilkins participated in the discussions, expressed understanding, and voiced agreement with the above plans.  All questions were answered to her satisfaction. she is encouraged to contact clinic should she have any questions or concerns prior to her return visit.   Follow up plan: -Return in about 3 months (around 12/29/2019) for Bring Meter and Logs- A1c in Office.  Glade Lloyd, MD Phone: [phone]  Fax: [phone]  This note was partially dictated with voice recognition software. Similar sounding words can be transcribed inadequately or may not  be corrected upon review.  09/28/2019, 6:57 PM

## 2019-09-28 NOTE — Telephone Encounter (Addendum)
Called and spoke with pt. Notified of Dr.Berry's recommendations and repeat labs. Pt verbalized understanding and had no other questions at this time. Will mail lab slip for hepatic function panel

## 2019-10-08 ENCOUNTER — Other Ambulatory Visit: Payer: Self-pay | Admitting: "Endocrinology

## 2019-10-15 ENCOUNTER — Telehealth: Payer: Self-pay | Admitting: Cardiovascular Disease

## 2019-10-15 NOTE — Telephone Encounter (Signed)
Patient called again she said she can be reached after 12pm as she has to go to a church meeting.  She can be reached at home [phone] or cell [phone]

## 2019-10-15 NOTE — Telephone Encounter (Signed)
Patient states she is returning a call to [place], requesting to discuss lab results. Please call.

## 2019-10-15 NOTE — Telephone Encounter (Signed)
Spoke with pt who voiced she has already been made aware of lab results and plan to repeat lipid in 2 months.

## 2019-10-15 NOTE — Telephone Encounter (Signed)
Attempted to return call-no answer and states "call cannot be completed at this time, try again later".

## 2019-10-20 ENCOUNTER — Other Ambulatory Visit: Payer: Self-pay

## 2019-10-20 ENCOUNTER — Encounter (INDEPENDENT_AMBULATORY_CARE_PROVIDER_SITE_OTHER): Payer: BC Managed Care – PPO | Admitting: Ophthalmology

## 2019-10-20 DIAGNOSIS — E113312 Type 2 diabetes mellitus with moderate nonproliferative diabetic retinopathy with macular edema, left eye: Secondary | ICD-10-CM | POA: Diagnosis not present

## 2019-10-20 DIAGNOSIS — H35033 Hypertensive retinopathy, bilateral: Secondary | ICD-10-CM

## 2019-10-20 DIAGNOSIS — I10 Essential (primary) hypertension: Secondary | ICD-10-CM

## 2019-10-20 DIAGNOSIS — H35371 Puckering of macula, right eye: Secondary | ICD-10-CM

## 2019-10-20 DIAGNOSIS — H2513 Age-related nuclear cataract, bilateral: Secondary | ICD-10-CM

## 2019-10-20 DIAGNOSIS — E113391 Type 2 diabetes mellitus with moderate nonproliferative diabetic retinopathy without macular edema, right eye: Secondary | ICD-10-CM | POA: Diagnosis not present

## 2019-10-20 DIAGNOSIS — E11311 Type 2 diabetes mellitus with unspecified diabetic retinopathy with macular edema: Secondary | ICD-10-CM | POA: Diagnosis not present

## 2019-10-20 DIAGNOSIS — H43813 Vitreous degeneration, bilateral: Secondary | ICD-10-CM

## 2019-10-27 ENCOUNTER — Other Ambulatory Visit: Payer: Self-pay

## 2019-10-27 ENCOUNTER — Encounter (INDEPENDENT_AMBULATORY_CARE_PROVIDER_SITE_OTHER): Payer: BC Managed Care – PPO | Admitting: Ophthalmology

## 2019-10-27 DIAGNOSIS — E11311 Type 2 diabetes mellitus with unspecified diabetic retinopathy with macular edema: Secondary | ICD-10-CM | POA: Diagnosis not present

## 2019-10-27 DIAGNOSIS — E113312 Type 2 diabetes mellitus with moderate nonproliferative diabetic retinopathy with macular edema, left eye: Secondary | ICD-10-CM

## 2019-10-28 ENCOUNTER — Telehealth: Payer: Self-pay

## 2019-10-28 NOTE — Telephone Encounter (Signed)
Opened in error

## 2019-12-10 ENCOUNTER — Other Ambulatory Visit: Payer: Self-pay

## 2019-12-10 DIAGNOSIS — Z79899 Other long term (current) drug therapy: Secondary | ICD-10-CM

## 2019-12-10 DIAGNOSIS — E782 Mixed hyperlipidemia: Secondary | ICD-10-CM

## 2019-12-10 LAB — HEPATIC FUNCTION PANEL
ALT: 23 IU/L (ref 0–32)
AST: 23 IU/L (ref 0–40)
Albumin: 4.4 g/dL (ref 3.7–4.7)
Alkaline Phosphatase: 78 IU/L (ref 48–121)
Bilirubin Total: 0.5 mg/dL (ref 0.0–1.2)
Bilirubin, Direct: 0.15 mg/dL (ref 0.00–0.40)
Total Protein: 5.8 g/dL — ABNORMAL LOW (ref 6.0–8.5)

## 2019-12-10 LAB — LIPID PANEL
Chol/HDL Ratio: 3.5 ratio (ref 0.0–4.4)
Cholesterol, Total: 163 mg/dL (ref 100–199)
HDL: 46 mg/dL (ref >39)
LDL Chol Calc (NIH): 92 mg/dL (ref 0–99)
Triglycerides: 142 mg/dL (ref 0–149)
VLDL Cholesterol Cal: 25 mg/dL (ref 5–40)

## 2019-12-23 ENCOUNTER — Other Ambulatory Visit: Payer: Self-pay | Admitting: "Endocrinology

## 2019-12-23 DIAGNOSIS — IMO0002 Reserved for concepts with insufficient information to code with codable children: Secondary | ICD-10-CM

## 2019-12-23 DIAGNOSIS — E1165 Type 2 diabetes mellitus with hyperglycemia: Secondary | ICD-10-CM

## 2019-12-30 ENCOUNTER — Encounter: Payer: Self-pay | Admitting: "Endocrinology

## 2019-12-30 ENCOUNTER — Telehealth (INDEPENDENT_AMBULATORY_CARE_PROVIDER_SITE_OTHER): Payer: BC Managed Care – PPO | Admitting: "Endocrinology

## 2019-12-30 VITALS — BP 133/50 | HR 88 | Ht 61.5 in | Wt 196.0 lb

## 2019-12-30 DIAGNOSIS — E118 Type 2 diabetes mellitus with unspecified complications: Secondary | ICD-10-CM

## 2019-12-30 DIAGNOSIS — Z794 Long term (current) use of insulin: Secondary | ICD-10-CM

## 2019-12-30 DIAGNOSIS — IMO0002 Reserved for concepts with insufficient information to code with codable children: Secondary | ICD-10-CM

## 2019-12-30 DIAGNOSIS — E782 Mixed hyperlipidemia: Secondary | ICD-10-CM

## 2019-12-30 DIAGNOSIS — E1165 Type 2 diabetes mellitus with hyperglycemia: Secondary | ICD-10-CM

## 2019-12-30 DIAGNOSIS — I10 Essential (primary) hypertension: Secondary | ICD-10-CM

## 2019-12-30 NOTE — Progress Notes (Signed)
12/30/2019                                                     Endocrinology Telehealth Visit Follow up Note -During COVID -19 Pandemic  This visit type was conducted  via telephone due to national recommendations for restrictions regarding the COVID-19 Pandemic  in an effort to limit this patient's exposure and mitigate transmission of the corona virus.  Due to her co-morbid illnesses, Taylor Wilkins is at  moderate to high risk for complications without adequate follow up.  This format is felt to be most appropriate for her at this time.  I connected with this patient on 12/30/2019   by telephone and verified that I am speaking with the correct person using two identifiers. Taylor Wilkins, [birth year]. she has verbally consented to this visit. I was in my office and patient was in her residence. No other persons were with me during the encounter. All issues noted in this document were discussed and addressed. The format was not optimal for physical exam.    Subjective:    Patient ID: Taylor Wilkins, female    DOB: [birth year], PCP Tillman Abide, FNP   Past Medical History:  Diagnosis Date  . Cancer ([place])    Basal cell on face  . Chest pain 08/2008   normal coronary angiogram  . Diabetes ([place])   . Hyperlipemia   . Hypertension   . 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 Sleep apnea    Past Surgical History:  Procedure Laterality Date  . ABDOMINAL HYSTERECTOMY    . ANKLE SURGERY  2006  . TONSILLECTOMY  1957   Social History   Socioeconomic History  . Marital status: Married    Spouse name: Not on file  . Number of children: Not on file  . Years of education: Not on file  . Highest education level: Not on file  Occupational History  . Not on file  Tobacco Use  . Smoking status: Never Smoker  . Smokeless tobacco: Never Used  Vaping Use  . Vaping Use: Never used  Substance and Sexual Activity  . Alcohol use: Never  . Drug use: Never  . Sexual activity: Not on file  Other  Topics Concern  . Not on file  Social History Narrative  . Not on file   Social Determinants of Health   Financial Resource Strain:   . Difficulty of Paying Living Expenses: Not on file  Food Insecurity:   . Worried About Charity fundraiser in the Last Year: Not on file  . Ran Out of Food in the Last Year: Not on file  Transportation Needs:   . Lack of Transportation (Medical): Not on file  . Lack of Transportation (Non-Medical): Not on file  Physical Activity:   . Days of Exercise per Week: Not on file  . Minutes of Exercise per Session: Not on file  Stress:   . Feeling of Stress : Not on file  Social Connections:   . Frequency of Communication with Friends and Family: Not on file  . Frequency of Social Gatherings with Friends and Family: Not on file  . Attends Religious Services: Not on file  . Active Member of Clubs or Organizations: Not on file  . Attends Archivist Meetings: Not on file  .  Marital Status: Not on file   Outpatient Encounter Medications as of 12/30/2019  Medication Sig  . Glucosamine-Chondroitin (MOVE FREE PO) Take 1 tablet by mouth daily.  . Magnesium 250 MG TABS Take 1 tablet by mouth daily.  Marland Kitchen pyridOXINE (VITAMIN B-6) 100 MG tablet Take 100 mg by mouth daily.  Marland Kitchen aspirin 81 MG tablet Take 81 mg by mouth daily.  Marland Kitchen atorvastatin (LIPITOR) 10 MG tablet Take 10 mg by mouth daily.  . B-D UF III MINI PEN NEEDLES 31G X 5 MM MISC USE 1 NEEDLE AS DIRECTED  . Calcium Citrate-Vitamin D (CALCIUM + D PO) Take by mouth.  . cholecalciferol (VITAMIN D) 1000 UNITS tablet Take 1,000 Units by mouth daily.  . Continuous Blood Gluc Sensor (FREESTYLE LIBRE 14 DAY SENSOR) MISC USE ONE SENSOR EVERY 14 DAYS  . Cranberry 400 MG TABS Take 4,200 mg by mouth daily.  . diclofenac (VOLTAREN) 75 MG EC tablet diclofenac sodium 75 mg tablet,delayed release  TAKE ONE TABLET BY MOUTH TWICE A DAY AS NEEDED  . Fish Oil-Cholecalciferol (FISH OIL + D3) 1000-1000 MG-UNIT CAPS Take by  mouth.  . furosemide (LASIX) 20 MG tablet Take 20 mg by mouth daily. As needed  . gabapentin (NEURONTIN) 300 MG capsule Take 300 mg by mouth 3 (three) times daily.   . insulin lispro (HUMALOG KWIKPEN) 200 UNIT/ML KwikPen Inject 12-15 Units into the skin 3 (three) times daily before meals.  . Insulin Pen Needle (BD PEN NEEDLE NANO U/F) 32G X 4 MM MISC 1 each by Does not apply route 4 (four) times daily.  Marland Kitchen lisinopril (ZESTRIL) 20 MG tablet Take 20 mg by mouth daily with breakfast.   . TOUJEO MAX SOLOSTAR 300 UNIT/ML Solostar Pen INJECT 52 UNITS UNDER THE SKIN AT BEDTIME  . verapamil (COVERA HS) 240 MG (CO) 24 hr tablet Take 240 mg by mouth at bedtime.  . vitamin B-12 (CYANOCOBALAMIN) 1000 MCG tablet Take 1,000 mcg by mouth daily.  . [DISCONTINUED] IRON PO Take 26 mg by mouth 2 (two) times daily.   No facility-administered encounter medications on file as of 12/30/2019.   ALLERGIES: No Known Allergies VACCINATION STATUS:  There is no immunization history on file for this patient.  Diabetes She presents for her follow-up diabetic visit. She has type 2 diabetes mellitus. Onset time: He was diagnosed at approximate age of 34 years. Her disease course has been worsening. There are no hypoglycemic associated symptoms. Pertinent negatives for hypoglycemia include no confusion, headaches, pallor or seizures. Pertinent negatives for diabetes include no chest pain, no polydipsia, no polyphagia and no polyuria. There are no hypoglycemic complications. 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 She was found to have severe hyperglycemia in to 400s, this necessitated increasing her Toujeo to 50 units daily. ). Diabetic complications include retinopathy. Risk factors for coronary artery disease include diabetes mellitus, dyslipidemia, hypertension and tobacco exposure. Current diabetic treatment includes insulin injections and oral agent (dual  therapy). Her weight is fluctuating minimally. She is following a generally unhealthy diet. She has had a previous visit with a dietitian. She rarely participates in exercise. There is no change in her home blood glucose trend. Her breakfast blood glucose range is generally 140-180 mg/dl. Her lunch blood glucose range is generally 180-200 mg/dl. Her dinner blood glucose range is generally 180-200 mg/dl. Her bedtime blood glucose range is generally 180-200 mg/dl. Her overall blood glucose range is 180-200 mg/dl. (She controlled fasting glycemic  Profile  119-183, lunchtime between 106-202, suppertime between 156-280, bedtime between 139-270.  No hypoglycemia, however she worries about hypoglycemia.  Her recent point-of-care A1c was 7.8%. ) An ACE inhibitor/angiotensin II receptor blocker is being taken. Eye exam is current.  Hyperlipidemia This is a chronic problem. The current episode started more than 1 year ago. The problem is controlled. Pertinent negatives include no chest pain, myalgias or shortness of breath. Current antihyperlipidemic treatment includes statins. Risk factors for coronary artery disease include diabetes mellitus, obesity, a sedentary lifestyle and post-menopausal.  Hypertension This is a chronic problem. The current episode started more than 1 year ago. The problem is controlled. Pertinent negatives include no chest pain, headaches, palpitations or shortness of breath. Risk factors for coronary artery disease include diabetes mellitus and dyslipidemia. Past treatments include calcium channel blockers. Hypertensive end-organ damage includes retinopathy.     Review of systems  Limited as above.   Objective:    BP (!) 133/50   Pulse 88   Ht 5' 1.5" (1.562 m)   Wt 196 lb (88.9 kg)   BMI 36.43 kg/m   Wt Readings from Last 3 Encounters:  12/30/19 196 lb (88.9 kg)  09/28/19 196 lb 9.6 oz (89.2 kg)  09/17/19 193 lb (87.5 kg)        Results for orders placed or performed  in visit on 12/10/19  Hepatic function panel  Result Value Ref Range   Total Protein 5.8 (L) 6.0 - 8.5 g/dL   Albumin 4.4 3.7 - 4.7 g/dL   Bilirubin Total 0.5 0.0 - 1.2 mg/dL   Bilirubin, Direct 0.15 0.00 - 0.40 mg/dL   Alkaline Phosphatase 78 48 - 121 IU/L   AST 23 0 - 40 IU/L   ALT 23 0 - 32 IU/L  Lipid panel  Result Value Ref Range   Cholesterol, Total 163 100 - 199 mg/dL   Triglycerides 142 0 - 149 mg/dL   HDL 46 >39 mg/dL   VLDL Cholesterol Cal 25 5 - 40 mg/dL   LDL Chol Calc (NIH) 92 0 - 99 mg/dL   Chol/HDL Ratio 3.5 0.0 - 4.4 ratio   Lipid Panel     Component Value Date/Time   CHOL 163 12/10/2019 0905   TRIG 142 12/10/2019 0905   HDL 46 12/10/2019 0905   CHOLHDL 3.5 12/10/2019 0905   LDLCALC 92 12/10/2019 0905   Recent Results (from the past 2160 hour(s))  Hepatic function panel     Status: Abnormal   Collection Time: 12/10/19  9:05 AM  Result Value Ref Range   Total Protein 5.8 (L) 6.0 - 8.5 g/dL   Albumin 4.4 3.7 - 4.7 g/dL   Bilirubin Total 0.5 0.0 - 1.2 mg/dL   Bilirubin, Direct 0.15 0.00 - 0.40 mg/dL   Alkaline Phosphatase 78 48 - 121 IU/L   AST 23 0 - 40 IU/L   ALT 23 0 - 32 IU/L  Lipid panel     Status: None   Collection Time: 12/10/19  9:05 AM  Result Value Ref Range   Cholesterol, Total 163 100 - 199 mg/dL   Triglycerides 142 0 - 149 mg/dL   HDL 46 >39 mg/dL   VLDL Cholesterol Cal 25 5 - 40 mg/dL   LDL Chol Calc (NIH) 92 0 - 99 mg/dL   Chol/HDL Ratio 3.5 0.0 - 4.4 ratio    Comment:  T. Chol/HDL Ratio                                             Men  Women                               1/2 Avg.Risk  3.4    3.3                                   Avg.Risk  5.0    4.4                                2X Avg.Risk  9.6    7.1                                3X Avg.Risk 23.4   11.0     Assessment & Plan:   1. Uncontrolled type 2 diabetes mellitus with complication. She reports controlled fasting glycemic  Profile  119-183, lunchtime between 106-202, suppertime between 156-280, bedtime between 139-270.  No hypoglycemia, however she worries about hypoglycemia.  Her recent point-of-care A1c was 7.8%.  Patient remains at a high risk for more acute and chronic complications of diabetes which include CAD, CVA, CKD, retinopathy, and neuropathy. These are all discussed in detail with the patient. Glucose logs and insulin administration records pertaining to this visit,  to be scanned into patient's records.  Recent labs reviewed.   - I have re-counseled the patient on diet management  by adopting a carbohydrate restricted / protein rich  Diet.  - she  admits there is a room for improvement in her diet and drink choices. -  Suggestion is made for her to avoid simple carbohydrates  from her diet including Cakes, Sweet Desserts / Pastries, Ice Cream, Soda (diet and regular), Sweet Tea, Candies, Chips, Cookies, Sweet Pastries,  Store Bought Juices, Alcohol in Excess of  1-2 drinks a day, Artificial Sweeteners, Coffee Creamer, and "Sugar-free" Products. This will help patient to have stable blood glucose profile and potentially avoid unintended weight gain.   - Patient is advised to stick to a routine mealtimes to eat 3 meals  a day and avoid unnecessary snacks ( to snack only to correct hypoglycemia).   - I have approached patient with the following individualized plan to manage diabetes and patient agrees.  - She will continue to need intensive treatment with basal/bolus insulin in order for her to maintain control of diabetes to target.    Continue to need intensive treatment with basal/bolus insulin.   -She is advised to continue Toujeo 52 units nightly, continue Humalog  12   units 3 times a day before meals for pre-meal glucose above 90 mg/dL,  associated with strict documenting blood glucose at least 4 times a day-before meals and at bedtime.   -She is wearing the Libre CGM device-advised to wear it at all  times.    -Patient is encouraged to call clinic for blood glucose levels less than 70 or above 200 mg /dl x 3 readings in a row.  -  She will stay off of metformin for now.   -  She is not a good candidate for incretin therapy, SGLT2 inhibitors . Her anti-GAD and anti-islet cell antibodies are negative.  2) BP/HTN: Her blood pressure is not controlled to target.  She is advised to increase her lisinopril to 20 mg p.o. daily at breakfast.  She also takes verapamil 240 mg p.o. daily.   3) Lipids/HPL: Her lipid panel showed loss of control of LDL at 139, because patient stopped her pravastatin since last visit.  She was recently initiated on atorvastatin 10 mg p.o. nightly.  She is advised to continue.     4)  Weight/Diet: Her BMI 36.5-complicating her diabetes care.  She is a candidate for modest weight loss.  CDE consult in progress, exercise, and carbohydrates information provided. -Her thyroid function test is within normal limits.      5) Chronic Care/Health Maintenance:  -Patient is on ACEI/ARB and Statin medications and encouraged to continue to follow up with Ophthalmology, Podiatrist at least yearly or according to recommendations, and advised to  stay away from smoking. I have recommended yearly flu vaccine and pneumonia vaccination at least every 5 years; moderate intensity exercise for up to 150 minutes weekly; and  sleep for at least 7 hours a day.  - I advised patient to maintain close follow up with her PCP for primary care needs.  - Time spent on this patient care encounter:  35 min, of which > 50% was spent in  counseling and the rest reviewing her blood glucose logs , discussing her hypoglycemia and hyperglycemia episodes, reviewing her current and  previous labs / studies  ( including abstraction from other facilities) and medications  doses and developing a  long term treatment plan and documenting her care.   Please refer to Patient Instructions for Blood Glucose Monitoring  and Insulin/Medications Dosing Guide"  in media tab for additional information. Please  also refer to " Patient Self Inventory" in the Media  tab for reviewed elements of pertinent patient history.  Taylor Wilkins participated in the discussions, expressed understanding, and voiced agreement with the above plans.  All questions were answered to her satisfaction. she is encouraged to contact clinic should she have any questions or concerns prior to her return visit.   Follow up plan: -Return in about 3 months (around 03/31/2020) for Bring Meter and Logs- A1c in Office.  Glade Lloyd, MD Phone: [phone]  Fax: [phone]  This note was partially dictated with voice recognition software. Similar sounding words can be transcribed inadequately or may not  be corrected upon review.  12/30/2019, 4:46 PM

## 2020-01-14 ENCOUNTER — Telehealth: Payer: Self-pay | Admitting: "Endocrinology

## 2020-01-14 NOTE — Telephone Encounter (Signed)
Discussed with pt, understanding voiced. 

## 2020-01-14 NOTE — Telephone Encounter (Signed)
A1c 4.5 is unlikely in her case, unless she has been running very low lately. She can just follow her sliding scale coverage for any possible increase in her glucose related to knee injection.

## 2020-01-14 NOTE — Telephone Encounter (Signed)
Patient is calling and states that she had her A1C done at her PCP office on Monday 8/30 to get a knee injection and her A1C was 4.5, she would like to know Dr. Dorris Fetch recommendations  [phone]

## 2020-02-28 ENCOUNTER — Encounter (INDEPENDENT_AMBULATORY_CARE_PROVIDER_SITE_OTHER): Payer: BC Managed Care – PPO | Admitting: Ophthalmology

## 2020-03-10 ENCOUNTER — Encounter (INDEPENDENT_AMBULATORY_CARE_PROVIDER_SITE_OTHER): Payer: BC Managed Care – PPO | Admitting: Ophthalmology

## 2020-03-10 DIAGNOSIS — I10 Essential (primary) hypertension: Secondary | ICD-10-CM | POA: Diagnosis not present

## 2020-03-10 DIAGNOSIS — E11311 Type 2 diabetes mellitus with unspecified diabetic retinopathy with macular edema: Secondary | ICD-10-CM | POA: Diagnosis not present

## 2020-03-10 DIAGNOSIS — H35371 Puckering of macula, right eye: Secondary | ICD-10-CM

## 2020-03-10 DIAGNOSIS — E113311 Type 2 diabetes mellitus with moderate nonproliferative diabetic retinopathy with macular edema, right eye: Secondary | ICD-10-CM

## 2020-03-10 DIAGNOSIS — H43813 Vitreous degeneration, bilateral: Secondary | ICD-10-CM

## 2020-03-10 DIAGNOSIS — E113392 Type 2 diabetes mellitus with moderate nonproliferative diabetic retinopathy without macular edema, left eye: Secondary | ICD-10-CM

## 2020-03-10 DIAGNOSIS — H35033 Hypertensive retinopathy, bilateral: Secondary | ICD-10-CM

## 2020-03-20 ENCOUNTER — Other Ambulatory Visit: Payer: Self-pay | Admitting: "Endocrinology

## 2020-04-04 ENCOUNTER — Ambulatory Visit: Payer: BC Managed Care – PPO | Admitting: "Endocrinology

## 2020-04-19 LAB — COMPREHENSIVE METABOLIC PANEL
GFR calc Af Amer: 65
GFR calc non Af Amer: 56

## 2020-04-19 LAB — BASIC METABOLIC PANEL
BUN: 23 — AB (ref 4–21)
Creatinine: 1 (ref 0.5–1.1)

## 2020-04-24 ENCOUNTER — Encounter: Payer: Self-pay | Admitting: "Endocrinology

## 2020-04-24 ENCOUNTER — Other Ambulatory Visit: Payer: Self-pay

## 2020-04-24 ENCOUNTER — Ambulatory Visit (INDEPENDENT_AMBULATORY_CARE_PROVIDER_SITE_OTHER): Payer: BC Managed Care – PPO | Admitting: "Endocrinology

## 2020-04-24 VITALS — BP 153/72 | HR 70 | Ht 61.5 in | Wt 198.4 lb

## 2020-04-24 DIAGNOSIS — Z794 Long term (current) use of insulin: Secondary | ICD-10-CM | POA: Diagnosis not present

## 2020-04-24 DIAGNOSIS — IMO0002 Reserved for concepts with insufficient information to code with codable children: Secondary | ICD-10-CM

## 2020-04-24 DIAGNOSIS — E1165 Type 2 diabetes mellitus with hyperglycemia: Secondary | ICD-10-CM | POA: Diagnosis not present

## 2020-04-24 DIAGNOSIS — I10 Essential (primary) hypertension: Secondary | ICD-10-CM

## 2020-04-24 DIAGNOSIS — E118 Type 2 diabetes mellitus with unspecified complications: Secondary | ICD-10-CM

## 2020-04-24 DIAGNOSIS — E782 Mixed hyperlipidemia: Secondary | ICD-10-CM | POA: Diagnosis not present

## 2020-04-24 LAB — POCT GLYCOSYLATED HEMOGLOBIN (HGB A1C): HbA1c, POC (controlled diabetic range): 6 % (ref 0.0–7.0)

## 2020-04-24 MED ORDER — FREESTYLE LIBRE 14 DAY SENSOR MISC: 5 refills | Status: DC

## 2020-04-24 MED ORDER — HUMALOG KWIKPEN 200 UNIT/ML ~~LOC~~ SOPN
10.0000 [IU] | PEN_INJECTOR | Freq: Three times a day (TID) | SUBCUTANEOUS | 3 refills | Status: DC
Start: 2020-04-24 — End: 2020-10-30

## 2020-04-24 MED ORDER — TOUJEO MAX SOLOSTAR 300 UNIT/ML ~~LOC~~ SOPN: 50.0000 [IU] | PEN_INJECTOR | Freq: Every day | SUBCUTANEOUS | 1 refills | Status: DC

## 2020-04-24 NOTE — Progress Notes (Signed)
04/24/2020   Endocrinology follow-up note  Subjective:    Patient ID: Taylor Wilkins, female    DOB: Jan 04, 1946, PCP Tillman Abide, FNP   Past Medical History:  Diagnosis Date  . Cancer ([place])    Basal cell on face  . Chest pain 08/2008   normal coronary angiogram  . Diabetes ([place])   . Hyperlipemia   . Hypertension   . 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 Sleep apnea    Past Surgical History:  Procedure Laterality Date  . ABDOMINAL HYSTERECTOMY    . ANKLE SURGERY  2006  . TONSILLECTOMY  1957   Social History   Socioeconomic History  . Marital status: Married    Spouse name: Not on file  . Number of children: Not on file  . Years of education: Not on file  . Highest education level: Not on file  Occupational History  . Not on file  Tobacco Use  . Smoking status: Never Smoker  . Smokeless tobacco: Never Used  Vaping Use  . Vaping Use: Never used  Substance and Sexual Activity  . Alcohol use: Never  . Drug use: Never  . Sexual activity: Not on file  Other Topics Concern  . Not on file  Social History Narrative  . Not on file   Social Determinants of Health   Financial Resource Strain: Not on file  Food Insecurity: Not on file  Transportation Needs: Not on file  Physical Activity: Not on file  Stress: Not on file  Social Connections: Not on file   Outpatient Encounter Medications as of 04/24/2020  Medication Sig  . aspirin 81 MG tablet Take 81 mg by mouth daily.  Marland Kitchen atorvastatin (LIPITOR) 10 MG tablet Take 10 mg by mouth daily.  . B-D UF III MINI PEN NEEDLES 31G X 5 MM MISC USE 1 NEEDLE AS DIRECTED  . Calcium Citrate-Vitamin D (CALCIUM + D PO) Take by mouth.  . cholecalciferol (VITAMIN D) 1000 UNITS tablet Take 1,000 Units by mouth daily.  . Continuous Blood Gluc Sensor (FREESTYLE LIBRE 14 DAY SENSOR) MISC USE ONE EVERY 14 DAYS  . diclofenac (VOLTAREN) 75 MG EC tablet diclofenac sodium 75 mg tablet,delayed release  TAKE ONE TABLET BY MOUTH TWICE A DAY  AS NEEDED  . escitalopram (LEXAPRO) 5 MG tablet Take 5 mg by mouth daily.  . Fish Oil-Cholecalciferol (FISH OIL + D3) 1000-1000 MG-UNIT CAPS Take by mouth.  . furosemide (LASIX) 20 MG tablet Take 20 mg by mouth daily. As needed  . gabapentin (NEURONTIN) 300 MG capsule Take 300 mg by mouth 3 (three) times daily.   . Ibrutinib (IMBRUVICA) 280 MG TABS Take 1 tablet by mouth daily in the afternoon.  . insulin glargine, 2 Unit Dial, (TOUJEO MAX SOLOSTAR) 300 UNIT/ML Solostar Pen Inject 50 Units into the skin at bedtime.  . insulin lispro (HUMALOG KWIKPEN) 200 UNIT/ML KwikPen Inject 10-13 Units into the skin 3 (three) times daily before meals.  . Insulin Pen Needle (BD PEN NEEDLE NANO U/F) 32G X 4 MM MISC 1 each by Does not apply route 4 (four) times daily.  Marland Kitchen lisinopril (ZESTRIL) 20 MG tablet Take 20 mg by mouth daily with breakfast.   . Probiotic Product (PROBIOTIC PO) Take 1 tablet by mouth daily in the afternoon.  . pyridOXINE (VITAMIN B-6) 100 MG tablet Take 100 mg by mouth daily.  . verapamil (COVERA HS) 240 MG (CO) 24 hr tablet Take 240 mg by mouth at bedtime.  Marland Kitchen  vitamin B-12 (CYANOCOBALAMIN) 1000 MCG tablet Take 1,000 mcg by mouth daily.  . [DISCONTINUED] Continuous Blood Gluc Sensor (FREESTYLE LIBRE 14 DAY SENSOR) MISC USE ONE EVERY 14 DAYS  . [DISCONTINUED] Cranberry 400 MG TABS Take 4,200 mg by mouth daily.  . [DISCONTINUED] Glucosamine-Chondroitin (MOVE FREE PO) Take 1 tablet by mouth daily.  . [DISCONTINUED] insulin lispro (HUMALOG KWIKPEN) 200 UNIT/ML KwikPen Inject 12-15 Units into the skin 3 (three) times daily before meals.  . [DISCONTINUED] Magnesium 250 MG TABS Take 1 tablet by mouth daily.  . [DISCONTINUED] TOUJEO MAX SOLOSTAR 300 UNIT/ML Solostar Pen INJECT 52 UNITS UNDER THE SKIN AT BEDTIME   No facility-administered encounter medications on file as of 04/24/2020.   ALLERGIES: No Known Allergies VACCINATION STATUS:  There is no immunization history on file for this  patient.  Diabetes She presents for her follow-up diabetic visit. She has type 2 diabetes mellitus. Onset time: He was diagnosed at approximate age of 72 years. Her disease course has been improving. There are no hypoglycemic associated symptoms. Pertinent negatives for hypoglycemia include no confusion, headaches, pallor or seizures. Pertinent negatives for diabetes include no chest pain, no polydipsia, no polyphagia and no polyuria. There are no hypoglycemic complications. 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 She was found to have severe hyperglycemia in to 400s, this necessitated increasing her Toujeo to 50 units daily. ). Diabetic complications include retinopathy. Risk factors for coronary artery disease include diabetes mellitus, dyslipidemia, hypertension and tobacco exposure. Current diabetic treatment includes insulin injections and oral agent (dual therapy). Her weight is fluctuating minimally. She is following a generally unhealthy diet. She has had a previous visit with a dietitian. She rarely participates in exercise. Her home blood glucose trend is decreasing steadily. Her breakfast blood glucose range is generally 130-140 mg/dl. Her lunch blood glucose range is generally 140-180 mg/dl. Her dinner blood glucose range is generally 140-180 mg/dl. Her bedtime blood glucose range is generally 140-180 mg/dl. Her overall blood glucose range is 140-180 mg/dl. (She has controlled fasting glycemic, CGM analysis shows 55% time in range, 43 % above but near target, 2 % mild hypoglycemia. Her POC A1c is 6% improving from  7.8%. ) An ACE inhibitor/angiotensin II receptor blocker is being taken. Eye exam is current.  Hyperlipidemia This is a chronic problem. The current episode started more than 1 year ago. The problem is controlled. Pertinent negatives include no chest pain, myalgias or shortness of breath. Current antihyperlipidemic  treatment includes statins. Risk factors for coronary artery disease include diabetes mellitus, obesity, a sedentary lifestyle and post-menopausal.  Hypertension This is a chronic problem. The current episode started more than 1 year ago. The problem is controlled. Pertinent negatives include no chest pain, headaches, palpitations or shortness of breath. Risk factors for coronary artery disease include diabetes mellitus and dyslipidemia. Past treatments include calcium channel blockers. Hypertensive end-organ damage includes retinopathy.     Review of systems  Limited as above.   Objective:    BP (!) 153/72   Pulse 70   Ht 5' 1.5" (1.562 m)   Wt 198 lb 6.4 oz (90 kg)   BMI 36.88 kg/m   Wt Readings from Last 3 Encounters:  04/24/20 198 lb 6.4 oz (90 kg)  12/30/19 196 lb (88.9 kg)  09/28/19 196 lb 9.6 oz (89.2 kg)        Results for orders placed or performed in visit on 02/54/27  Basic metabolic panel  Result Value  Ref Range   BUN 23 (A) 4 - 21   Creatinine 1.0 0.5 - 1.1  Comprehensive metabolic panel  Result Value Ref Range   GFR calc Af Amer 65    GFR calc non Af Amer 56   HgB A1c  Result Value Ref Range   Hemoglobin A1C     HbA1c POC (<> result, manual entry)     HbA1c, POC (prediabetic range)     HbA1c, POC (controlled diabetic range) 6.0 0.0 - 7.0 %   Lipid Panel     Component Value Date/Time   CHOL 163 12/10/2019 0905   TRIG 142 12/10/2019 0905   HDL 46 12/10/2019 0905   CHOLHDL 3.5 12/10/2019 0905   LDLCALC 92 12/10/2019 0905   Recent Results (from the past 2160 hour(s))  Basic metabolic panel     Status: Abnormal   Collection Time: 04/19/20 12:00 AM  Result Value Ref Range   BUN 23 (A) 4 - 21   Creatinine 1.0 0.5 - 1.1  Comprehensive metabolic panel     Status: None   Collection Time: 04/19/20 12:00 AM  Result Value Ref Range   GFR calc Af Amer 65    GFR calc non Af Amer 56   HgB A1c     Status: None   Collection Time: 04/24/20 10:21 AM  Result  Value Ref Range   Hemoglobin A1C     HbA1c POC (<> result, manual entry)     HbA1c, POC (prediabetic range)     HbA1c, POC (controlled diabetic range) 6.0 0.0 - 7.0 %    Assessment & Plan:   1. Uncontrolled type 2 diabetes mellitus with complication. She has controlled fasting glycemic, CGM analysis shows 55% time in range, 43 % above but near target, 2 % mild hypoglycemia. Her POC A1c is 6% improving from  7.8%.  Patient remains at a high risk for more acute and chronic complications of diabetes which include CAD, CVA, CKD, retinopathy, and neuropathy. These are all discussed in detail with the patient. Glucose logs and insulin administration records pertaining to this visit,  to be scanned into patient's records.  Recent labs reviewed.   - I have re-counseled the patient on diet management  by adopting a carbohydrate restricted / protein rich  Diet.  - she acknowledges that there is a room for improvement in her food and drink choices. - Suggestion is made for her to avoid simple carbohydrates  from her diet including Cakes, Sweet Desserts, Ice Cream, Soda (diet and regular), Sweet Tea, Candies, Chips, Cookies, Store Bought Juices, Alcohol in Excess of  1-2 drinks a day, Artificial Sweeteners,  Coffee Creamer, and "Sugar-free" Products, Lemonade. This will help patient to have more stable blood glucose profile and potentially avoid unintended weight gain.   - Patient is advised to stick to a routine mealtimes to eat 3 meals  a day and avoid unnecessary snacks ( to snack only to correct hypoglycemia).   - I have approached patient with the following individualized plan to manage diabetes and patient agrees.  - She will continue to need intensive treatment with basal/bolus insulin in order for her to maintain control of diabetes to target.   -To avoid inadvertent hypoglycemia, she is advised to lower her Toujeo to 50 units nightly, lower Humalog to 10  units 3 times a day before meals for  pre-meal glucose above 90 mg/dL,  associated with strict documenting blood glucose at least 4 times a day-before meals and at  bedtime.   -She is wearing the Libre CGM device-advised to wear it at all times.    -Patient is encouraged to call clinic for blood glucose levels less than 70 or above 200 mg /dl x 3 readings in a row.  -  She will stay off of metformin for now.   - She is not a good candidate for incretin therapy, SGLT2 inhibitors . Her anti-GAD and anti-islet cell antibodies are negative.  2) BP/HTN: Her blood pressure is slightly above target.  She is advised to increase her lisinopril to 20 mg p.o. daily at breakfast.  She also takes verapamil 240 mg p.o. daily.   3) Lipids/HPL: Her recent lipid panel showed improved LDL to 92 from 139.  She was recently reinitiated on atorvastatin 10 mg p.o. nightly.   She is advised to continue.     4)  Weight/Diet: Her BMI 36.88-complicating her diabetes care.  She is a candidate for modest weight loss.  CDE consult in progress, exercise, and carbohydrates information provided. -Her thyroid function test is within normal limits.      5) Chronic Care/Health Maintenance:  -Patient is on ACEI/ARB and Statin medications and encouraged to continue to follow up with Ophthalmology, Podiatrist at least yearly or according to recommendations, and advised to  stay away from smoking. I have recommended yearly flu vaccine and pneumonia vaccination at least every 5 years; moderate intensity exercise for up to 150 minutes weekly; and  sleep for at least 7 hours a day.  POCT ABI Results 04/24/20  Her ABIs normal today Right ABI: 1.29      left ABI: 1.29  Right leg systolic / diastolic: 287/68 mmHg Left leg systolic / diastolic: 115/72 mmHg  Arm systolic / diastolic: 620/35 mmHG This study will be repeated in 5 years, December 2026, or sooner if needed.  - I advised patient to maintain close follow up with her PCP for primary care needs.  - Time  spent on this patient care encounter:  40 min, of which > 50% was spent in  counseling and the rest reviewing her blood glucose logs , discussing her hypoglycemia and hyperglycemia episodes, reviewing her current and  previous labs / studies  ( including abstraction from other facilities) and medications  doses and developing a  long term treatment plan and documenting her care.   Please refer to Patient Instructions for Blood Glucose Monitoring and Insulin/Medications Dosing Guide"  in media tab for additional information. Please  also refer to " Patient Self Inventory" in the Media  tab for reviewed elements of pertinent patient history.  Taylor Wilkins participated in the discussions, expressed understanding, and voiced agreement with the above plans.  All questions were answered to her satisfaction. she is encouraged to contact clinic should she have any questions or concerns prior to her return visit.    Follow up plan: -Return in about 6 months (around 10/23/2020) for F/U with Pre-visit Labs, Meter, Logs, A1c here.Glade Lloyd, MD Phone: [phone]  Fax: [phone]  This note was partially dictated with voice recognition software. Similar sounding words can be transcribed inadequately or may not  be corrected upon review.  04/24/2020, 11:21 AM

## 2020-04-24 NOTE — Patient Instructions (Signed)

## 2020-05-23 ENCOUNTER — Other Ambulatory Visit: Payer: Self-pay | Admitting: "Endocrinology

## 2020-07-18 ENCOUNTER — Telehealth: Payer: Self-pay | Admitting: Cardiovascular Disease

## 2020-07-18 NOTE — Telephone Encounter (Signed)
3.8.22 Spoke to pt regarding questionnaire received 3.8.22 from [facility] requesting an app. W/Dr Gwenlyn Found. Pt already schd for 09/15/20 w/Dr Gwenlyn Found. Pt wants to keep app. as schd. Pt has app w/[facility] next week and will get clarificaiton on necessity of earlier app. lp

## 2020-08-28 ENCOUNTER — Other Ambulatory Visit: Payer: Self-pay | Admitting: "Endocrinology

## 2020-08-29 ENCOUNTER — Other Ambulatory Visit: Payer: Self-pay

## 2020-08-29 DIAGNOSIS — IMO0002 Reserved for concepts with insufficient information to code with codable children: Secondary | ICD-10-CM

## 2020-08-29 DIAGNOSIS — E1165 Type 2 diabetes mellitus with hyperglycemia: Secondary | ICD-10-CM

## 2020-08-29 MED ORDER — TOUJEO SOLOSTAR 300 UNIT/ML ~~LOC~~ SOPN: 50.0000 [IU] | PEN_INJECTOR | Freq: Every day | SUBCUTANEOUS | 0 refills | Status: DC

## 2020-09-05 ENCOUNTER — Telehealth: Payer: Self-pay | Admitting: "Endocrinology

## 2020-09-05 NOTE — Telephone Encounter (Signed)
[place] called and said that [employer] and then [employer] was called in and they are needing to know which one she needs to be on or if its supposed to be both. [phone]

## 2020-09-05 NOTE — Telephone Encounter (Signed)
Tried to call [facility] but was unable to receive an answer.

## 2020-09-06 NOTE — Telephone Encounter (Signed)
Spoke with pharmacy tech at [facility], she stated they were able to resolve issue with medication.

## 2020-09-08 ENCOUNTER — Encounter (INDEPENDENT_AMBULATORY_CARE_PROVIDER_SITE_OTHER): Payer: BC Managed Care – PPO | Admitting: Ophthalmology

## 2020-09-15 ENCOUNTER — Ambulatory Visit (INDEPENDENT_AMBULATORY_CARE_PROVIDER_SITE_OTHER): Payer: BC Managed Care – PPO | Admitting: Cardiovascular Disease

## 2020-09-15 ENCOUNTER — Other Ambulatory Visit: Payer: Self-pay

## 2020-09-15 ENCOUNTER — Encounter: Payer: Self-pay | Admitting: Cardiovascular Disease

## 2020-09-15 DIAGNOSIS — I10 Essential (primary) hypertension: Secondary | ICD-10-CM | POA: Diagnosis not present

## 2020-09-15 DIAGNOSIS — E782 Mixed hyperlipidemia: Secondary | ICD-10-CM

## 2020-09-15 NOTE — Assessment & Plan Note (Signed)
History of mixed hyperlipidemia on statin therapy with lipid profile performed 12/10/2019 revealing a total cholesterol 163, LDL 92 and HDL 46.

## 2020-09-15 NOTE — Assessment & Plan Note (Signed)
History of essential hypertension blood pressure measured today at 160/68.  She says normally her blood pressure is much better at home.  She is on lisinopril 20 mg a day as well as verapamil.

## 2020-09-15 NOTE — Patient Instructions (Signed)

## 2020-09-15 NOTE — Progress Notes (Signed)
09/15/2020 Taylor Wilkins Wilkins   1945/12/07  [id number]  Primary Physician Taylor Wilkins Wilkins, [place] Primary Cardiologist: Taylor Wilkins Harp MD Taylor Wilkins Wilkins, [place]  HPI:  Taylor Wilkins Wilkins is a 75 y.o.  married Caucasian female mother of 60, grandmother and 6 grandchildren referred by Taylor Harbour WinickerFNP in [place] to be reestablished our practice.I last saw her in the office  09/17/2019.She had previously seen Dr. Elisabeth Wilkins in our practice years ago and apparently had a normal cardiac catheterization after false positive GXT. She does work as a Teacher, early years/pre. She has cardiac risk factors notable for 2 hypertension and hyperlipidemia as well as diabetes. She does not smoke. Her mother did die of a myocardial infarction at age 62. She had new onset dyspnea on exertion over several months prior to her last office visit which prompted obtaining a 2-D echo and a Myoview stress test on 11/08/16 which were normal. Her symptoms have since resolved. She has had CLL for last 3 years andhas recently finished chemotherapy for this.This resulted in her beginning insulin and steroids which has apparently led to a 15 pound weight gain.  Since I saw her over a year ago she is remained stable.  Her CLL has remained stable.  She has required some blood transfusions however. She is very active, denies chest pain or shortness of breath.      Current Meds  Medication Sig  . aspirin 81 MG tablet Take 81 mg by mouth daily.  Marland Kitchen atorvastatin (LIPITOR) 10 MG tablet Take 10 mg by mouth daily.  . B-D UF III MINI PEN NEEDLES 31G X 5 MM MISC USE 1 NEEDLE AS DIRECTED  . Calcium Citrate-Vitamin D (CALCIUM + D PO) Take by mouth.  . cholecalciferol (VITAMIN D) 1000 UNITS tablet Take 1,000 Units by mouth daily.  . Continuous Blood Gluc Sensor (FREESTYLE LIBRE 14 DAY SENSOR) MISC USE ONE EVERY 14 DAYS  . diclofenac Sodium (VOLTAREN) 1 % GEL Apply 1 application topically 4 (four) times daily.  Marland Kitchen escitalopram (LEXAPRO) 5 MG  tablet Take 5 mg by mouth daily.  . Fish Oil-Cholecalciferol (FISH OIL + D3) 1000-1000 MG-UNIT CAPS Take by mouth.  . furosemide (LASIX) 20 MG tablet Take 20 mg by mouth daily. As needed  . Ibrutinib (IMBRUVICA) 280 MG TABS Take 1 tablet by mouth daily in the afternoon.  . insulin glargine, 1 Unit Dial, (TOUJEO SOLOSTAR) 300 UNIT/ML Solostar Pen Inject 50 Units into the skin at bedtime.  . insulin lispro (HUMALOG KWIKPEN) 200 UNIT/ML KwikPen Inject 10-13 Units into the skin 3 (three) times daily before meals.  . Insulin Pen Needle (BD PEN NEEDLE NANO U/F) 32G X 4 MM MISC 1 each by Does not apply route 4 (four) times daily.  Marland Kitchen lisinopril (ZESTRIL) 20 MG tablet Take 20 mg by mouth daily with breakfast.   . Magnesium 250 MG TABS every day  . pregabalin (LYRICA) 50 MG capsule Lyrica 50 mg capsule  Take 1 capsule 3 times a day by oral route.  . Probiotic Product (PROBIOTIC PO) Take 1 tablet by mouth daily in the afternoon.  . verapamil (VERELAN PM) 360 MG 24 hr capsule Take 360 mg by mouth at bedtime.  . [DISCONTINUED] diclofenac (VOLTAREN) 75 MG EC tablet diclofenac sodium 75 mg tablet,delayed release  TAKE ONE TABLET BY MOUTH TWICE A DAY AS NEEDED  . [DISCONTINUED] gabapentin (NEURONTIN) 300 MG capsule Take 300 mg by mouth 3 (three) times daily.   . [DISCONTINUED] pyridOXINE (VITAMIN B-6)  100 MG tablet Take 100 mg by mouth daily.  . [DISCONTINUED] verapamil (COVERA HS) 240 MG (CO) 24 hr tablet Take 240 mg by mouth at bedtime.  . [DISCONTINUED] vitamin B-12 (CYANOCOBALAMIN) 1000 MCG tablet Take 1,000 mcg by mouth daily.     No Known Allergies  Social History   Socioeconomic History  . Marital status: Married    Spouse name: Not on file  . Number of children: Not on file  . Years of education: Not on file  . Highest education level: Not on file  Occupational History  . Not on file  Tobacco Use  . Smoking status: Never Smoker  . Smokeless tobacco: Never Used  Vaping Use  . Vaping Use:  Never used  Substance and Sexual Activity  . Alcohol use: Never  . Drug use: Never  . Sexual activity: Not on file  Other Topics Concern  . Not on file  Social History Narrative  . Not on file   Social Determinants of Health   Financial Resource Strain: Not on file  Food Insecurity: Not on file  Transportation Needs: Not on file  Physical Activity: Not on file  Stress: Not on file  Social Connections: Not on file  Intimate Partner Violence: Not on file     Review of Systems: General: negative for chills, fever, night sweats or weight changes.  Cardiovascular: negative for chest pain, dyspnea on exertion, edema, orthopnea, palpitations, paroxysmal nocturnal dyspnea or shortness of breath Dermatological: negative for rash Respiratory: negative for cough or wheezing Urologic: negative for hematuria Abdominal: negative for nausea, vomiting, diarrhea, bright red blood per rectum, melena, or hematemesis Neurologic: negative for visual changes, syncope, or dizziness All other systems reviewed and are otherwise negative except as noted above.    Blood pressure (!) 160/68, pulse 64, height 5\' 2"  (1.575 m), weight 188 lb (85.3 kg), SpO2 98 %.  General appearance: alert and no distress Neck: no adenopathy, no carotid bruit, no JVD, supple, symmetrical, trachea midline and thyroid not enlarged, symmetric, no tenderness/mass/nodules Lungs: clear to auscultation bilaterally Heart: regular rate and rhythm, S1, S2 normal, no murmur, click, rub or gallop Extremities: extremities normal, atraumatic, no cyanosis or edema Pulses: 2+ and symmetric Skin: Skin color, texture, turgor normal. No rashes or lesions Neurologic: Alert and oriented X 3, normal strength and tone. Normal symmetric reflexes. Normal coordination and gait  EKG sinus rhythm at 64 without ST or T wave changes.  Personally reviewed this EKG.  ASSESSMENT AND PLAN:   Mixed hyperlipidemia History of mixed hyperlipidemia on  statin therapy with lipid profile performed 12/10/2019 revealing a total cholesterol 163, LDL 92 and HDL 46.  Essential hypertension, benign History of essential hypertension blood pressure measured today at 160/68.  She says normally her blood pressure is much better at home.  She is on lisinopril 20 mg a day as well as verapamil.      Taylor Wilkins Harp MD FACP,FACC,FAHA, [facility] 09/15/2020 10:18 AM

## 2020-09-29 ENCOUNTER — Encounter (INDEPENDENT_AMBULATORY_CARE_PROVIDER_SITE_OTHER): Payer: BC Managed Care – PPO | Admitting: Ophthalmology

## 2020-10-16 ENCOUNTER — Other Ambulatory Visit: Payer: Self-pay

## 2020-10-16 ENCOUNTER — Encounter (INDEPENDENT_AMBULATORY_CARE_PROVIDER_SITE_OTHER): Payer: BC Managed Care – PPO | Admitting: Ophthalmology

## 2020-10-16 DIAGNOSIS — H35033 Hypertensive retinopathy, bilateral: Secondary | ICD-10-CM

## 2020-10-16 DIAGNOSIS — H35371 Puckering of macula, right eye: Secondary | ICD-10-CM

## 2020-10-16 DIAGNOSIS — H43813 Vitreous degeneration, bilateral: Secondary | ICD-10-CM

## 2020-10-16 DIAGNOSIS — E113393 Type 2 diabetes mellitus with moderate nonproliferative diabetic retinopathy without macular edema, bilateral: Secondary | ICD-10-CM

## 2020-10-16 DIAGNOSIS — I10 Essential (primary) hypertension: Secondary | ICD-10-CM

## 2020-10-16 DIAGNOSIS — H2513 Age-related nuclear cataract, bilateral: Secondary | ICD-10-CM

## 2020-10-17 ENCOUNTER — Telehealth: Payer: Self-pay | Admitting: Cardiovascular Disease

## 2020-10-17 ENCOUNTER — Telehealth: Payer: Self-pay | Admitting: "Endocrinology

## 2020-10-17 MED ORDER — ATORVASTATIN CALCIUM 10 MG PO TABS: 10.0000 mg | ORAL_TABLET | Freq: Every day | ORAL | 3 refills | Status: AC

## 2020-10-17 NOTE — Telephone Encounter (Signed)
We can see her and a1c here.

## 2020-10-17 NOTE — Telephone Encounter (Signed)
Pt has appt on Monday and did not do her CMP and her lab papers will not reach her in the mail before Monday and she wants to keep her appt. Can she still be seen for Monday

## 2020-10-17 NOTE — Telephone Encounter (Signed)
*  STAT* If patient is at the pharmacy, call can be transferred to refill team.   1. Which medications need to be refilled? (please list name of each medication and dose if known) atorvastatin (LIPITOR) 10 MG tablet  2. Which pharmacy/location (including street and city if local pharmacy) is medication to be sent to? [facility] [place], [place]  3. Do they need a 30 day or 90 day supply? 90 day supply

## 2020-10-23 ENCOUNTER — Ambulatory Visit: Payer: BC Managed Care – PPO | Admitting: "Endocrinology

## 2020-10-29 ENCOUNTER — Other Ambulatory Visit: Payer: Self-pay | Admitting: "Endocrinology

## 2020-10-30 ENCOUNTER — Encounter: Payer: Self-pay | Admitting: "Endocrinology

## 2020-10-30 ENCOUNTER — Ambulatory Visit (INDEPENDENT_AMBULATORY_CARE_PROVIDER_SITE_OTHER): Payer: BC Managed Care – PPO | Admitting: "Endocrinology

## 2020-10-30 ENCOUNTER — Other Ambulatory Visit: Payer: Self-pay

## 2020-10-30 VITALS — BP 162/68 | HR 64 | Ht 62.0 in | Wt 189.2 lb

## 2020-10-30 DIAGNOSIS — I10 Essential (primary) hypertension: Secondary | ICD-10-CM

## 2020-10-30 DIAGNOSIS — E782 Mixed hyperlipidemia: Secondary | ICD-10-CM | POA: Diagnosis not present

## 2020-10-30 DIAGNOSIS — Z794 Long term (current) use of insulin: Secondary | ICD-10-CM | POA: Diagnosis not present

## 2020-10-30 DIAGNOSIS — E1165 Type 2 diabetes mellitus with hyperglycemia: Secondary | ICD-10-CM | POA: Diagnosis not present

## 2020-10-30 DIAGNOSIS — IMO0002 Reserved for concepts with insufficient information to code with codable children: Secondary | ICD-10-CM

## 2020-10-30 DIAGNOSIS — E118 Type 2 diabetes mellitus with unspecified complications: Secondary | ICD-10-CM

## 2020-10-30 LAB — POCT GLYCOSYLATED HEMOGLOBIN (HGB A1C): HbA1c, POC (controlled diabetic range): 5.9 % (ref 0.0–7.0)

## 2020-10-30 MED ORDER — TOUJEO SOLOSTAR 300 UNIT/ML ~~LOC~~ SOPN
50.0000 [IU] | PEN_INJECTOR | Freq: Every day | SUBCUTANEOUS | 1 refills | Status: DC
Start: 2020-10-30 — End: 2021-12-26

## 2020-10-30 MED ORDER — HUMALOG KWIKPEN 200 UNIT/ML ~~LOC~~ SOPN: 12.0000 [IU] | PEN_INJECTOR | Freq: Three times a day (TID) | SUBCUTANEOUS | 3 refills | Status: DC

## 2020-10-30 NOTE — Progress Notes (Signed)
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   Sleep apnea    Past Surgical History:  Procedure Laterality Date   ABDOMINAL HYSTERECTOMY     ANKLE SURGERY  2006   TONSILLECTOMY  1957   Social History   Socioeconomic History   Marital status: Married    Spouse name: Not on file   Number of children: Not on file   Years of education: Not on file   Highest education level: Not on file  Occupational History   Not on file  Tobacco Use   Smoking status: Never   Smokeless tobacco: Never  Vaping Use   Vaping Use: Never used  Substance and Sexual Activity   Alcohol use: Never   Drug use: Never   Sexual activity: Not on file  Other Topics Concern   Not on file  Social History Narrative   Not on file   Social Determinants of Health   Financial Resource Strain: Not on file  Food Insecurity: Not on file  Transportation Needs: Not on file  Physical Activity: Not on file  Stress: Not on file  Social Connections: Not on file   Outpatient Encounter Medications as of 10/30/2020  Medication Sig   B Complex Vitamins (VITAMIN B COMPLEX PO) Take 1 tablet by mouth daily.   TURMERIC PO Take 1 tablet by mouth daily.   aspirin 81 MG tablet Take 81 mg by mouth daily.   atorvastatin (LIPITOR) 10 MG tablet Take 1 tablet (10 mg total) by mouth daily.   B-D UF III MINI PEN NEEDLES 31G X 5 MM MISC USE 1 NEEDLE AS DIRECTED   Calcium Citrate-Vitamin D (CALCIUM + D PO) Take by mouth.   cholecalciferol (VITAMIN D) 1000 UNITS tablet Take 1,000 Units by mouth daily.   diclofenac Sodium (VOLTAREN) 1 % GEL Apply 1 application topically 4 (four) times daily.   escitalopram (LEXAPRO) 5 MG  tablet Take 5 mg by mouth daily.   Fish Oil-Cholecalciferol (FISH OIL + D3) 1000-1000 MG-UNIT CAPS Take by mouth.   furosemide (LASIX) 20 MG tablet Take 20 mg by mouth daily. As needed   Ibrutinib (IMBRUVICA) 280 MG TABS Take 1 tablet by mouth daily in the afternoon.   insulin glargine, 1 Unit Dial, (TOUJEO SOLOSTAR) 300 UNIT/ML Solostar Pen Inject 50 Units into the skin at bedtime.   insulin lispro (HUMALOG KWIKPEN) 200 UNIT/ML KwikPen Inject 12-18 Units into the skin 3 (three) times daily before meals.   Insulin Pen Needle (BD PEN NEEDLE NANO U/F) 32G X 4 MM MISC 1 each by Does not apply route 4 (four) times daily.   lisinopril (ZESTRIL) 20 MG tablet Take 20 mg by mouth daily with breakfast.    Magnesium 250 MG TABS every day   pregabalin (LYRICA) 50 MG capsule Lyrica 50 mg capsule  Take 1 capsule 3 times a day by oral route.   Probiotic Product (PROBIOTIC PO) Take 1 tablet by mouth daily in the afternoon.   verapamil (VERELAN PM) 360 MG 24 hr capsule Take 360 mg by mouth at bedtime.   [DISCONTINUED] Continuous Blood  Gluc Sensor (FREESTYLE LIBRE 14 DAY SENSOR) MISC USE ONE EVERY 14 DAYS   [DISCONTINUED] insulin glargine, 1 Unit Dial, (TOUJEO SOLOSTAR) 300 UNIT/ML Solostar Pen Inject 50 Units into the skin at bedtime.   [DISCONTINUED] insulin lispro (HUMALOG KWIKPEN) 200 UNIT/ML KwikPen Inject 10-13 Units into the skin 3 (three) times daily before meals.   No facility-administered encounter medications on file as of 10/30/2020.   ALLERGIES: No Known Allergies VACCINATION STATUS:  There is no immunization history on file for this patient.  Diabetes She presents for her follow-up diabetic visit. She has type 2 diabetes mellitus. Onset time: He was diagnosed at approximate age of 8 years. Her disease course has been fluctuating. There are no hypoglycemic associated symptoms. Pertinent negatives for hypoglycemia include no confusion, headaches, pallor or seizures. Pertinent negatives for  diabetes include no chest pain, no polydipsia, no polyphagia and no polyuria. There are no hypoglycemic complications. Symptoms are improving. Diabetic complications include retinopathy. Risk factors for coronary artery disease include diabetes mellitus, dyslipidemia, hypertension and tobacco exposure. Current diabetic treatment includes insulin injections and oral agent (dual therapy). Her weight is fluctuating minimally. She is following a generally unhealthy diet. She has had a previous visit with a dietitian. She rarely participates in exercise. Her home blood glucose trend is fluctuating minimally. Her breakfast blood glucose range is generally 140-180 mg/dl. Her lunch blood glucose range is generally 180-200 mg/dl. Her dinner blood glucose range is generally 180-200 mg/dl. Her bedtime blood glucose range is generally 180-200 mg/dl. Her overall blood glucose range is 180-200 mg/dl. (She wears a CGM.  AGP report shows 33% time range, 65% above range.  She does have postprandial hyperglycemia.  She does not have significant hypoglycemia.  Over the last 14 days her average blood glucose is 214 mg per DL.  This is in sharp discrepancy with her point-of-care A1c of 5.9%.  She has anemia with hemoglobin of 10.9.   ) An ACE inhibitor/angiotensin II receptor blocker is being taken. Eye exam is current.  Hyperlipidemia This is a chronic problem. The current episode started more than 1 year ago. The problem is controlled. Exacerbating diseases include diabetes and obesity. Pertinent negatives include no chest pain, myalgias or shortness of breath. Current antihyperlipidemic treatment includes statins. Risk factors for coronary artery disease include diabetes mellitus, obesity, a sedentary lifestyle and post-menopausal.  Hypertension This is a chronic problem. The current episode started more than 1 year ago. The problem is controlled. Pertinent negatives include no chest pain, headaches, palpitations or shortness of  breath. Risk factors for coronary artery disease include diabetes mellitus, dyslipidemia, sedentary lifestyle, post-menopausal state and obesity. Past treatments include calcium channel blockers. Hypertensive end-organ damage includes retinopathy.    Review of systems  Limited as above.   Objective:    BP (!) 162/68   Pulse 64   Ht 5\' 2"  (1.575 m)   Wt 189 lb 3.2 oz (85.8 kg)   BMI 34.61 kg/m   Wt Readings from Last 3 Encounters:  10/30/20 189 lb 3.2 oz (85.8 kg)  09/15/20 188 lb (85.3 kg)  04/24/20 198 lb 6.4 oz (90 kg)        Results for orders placed or performed in visit on 10/30/20  HgB A1c  Result Value Ref Range   Hemoglobin A1C     HbA1c POC (<> result, manual entry)     HbA1c, POC (prediabetic range)     HbA1c, POC (controlled diabetic range) 5.9 0.0 - 7.0 %   Lipid  Panel     Component Value Date/Time   CHOL 163 12/10/2019 0905   TRIG 142 12/10/2019 0905   HDL 46 12/10/2019 0905   CHOLHDL 3.5 12/10/2019 0905   LDLCALC 92 12/10/2019 0905   Recent Results (from the past 2160 hour(s))  HgB A1c     Status: None   Collection Time: 10/30/20  9:52 AM  Result Value Ref Range   Hemoglobin A1C     HbA1c POC (<> result, manual entry)     HbA1c, POC (prediabetic range)     HbA1c, POC (controlled diabetic range) 5.9 0.0 - 7.0 %    Assessment & Plan:   1. Uncontrolled type 2 diabetes mellitus with complication.   She wears a CGM.  AGP report shows 33% time range, 65% above range.  She does have postprandial hyperglycemia.  She does not have significant hypoglycemia.  Over the last 14 days her average blood glucose is 214 mg per DL.  This is in sharp discrepancy with her point-of-care A1c of 5.9%.  She has anemia with hemoglobin of 10.9.      Patient remains at a high risk for more acute and chronic complications of diabetes which include CAD, CVA, CKD, retinopathy, and neuropathy. These are all discussed in detail with the patient. Glucose logs and insulin  administration records pertaining to this visit,  to be scanned into patient's records.  Recent labs reviewed.   - I have re-counseled the patient on diet management  by adopting a carbohydrate restricted / protein rich  Diet.  - she acknowledges that there is a room for improvement in her food and drink choices. - Suggestion is made for her to avoid simple carbohydrates  from her diet including Cakes, Sweet Desserts, Ice Cream, Soda (diet and regular), Sweet Tea, Candies, Chips, Cookies, Store Bought Juices, Alcohol in Excess of  1-2 drinks a day, Artificial Sweeteners,  Coffee Creamer, and "Sugar-free" Products, Lemonade. This will help patient to have more stable blood glucose profile and potentially avoid unintended weight gain.   - Patient is advised to stick to a routine mealtimes to eat 3 meals  a day and avoid unnecessary snacks ( to snack only to correct hypoglycemia).   - I have approached patient with the following individualized plan to manage diabetes and patient agrees.  - She we will continue to need intensive treatment with basal/bolus insulin in order for her to achieve and maintain control of diabetes to target.   She needs more coverage for her postprandial hyperglycemia.  -To avoid hypoglycemia is the priority in her care.   -I discussed and continue Toujeo at 50 units nightly, increase Humalog to 12  units 3 times a day before meals for pre-meal glucose above 90 mg/dL,  associated with strict documenting blood glucose at least 4 times a day-before meals and at bedtime.   -She is wearing the Libre CGM device-advised to wear it at all times.    -Patient is encouraged to call clinic for blood glucose levels less than 70 or above 200 mg /dl x 3 readings in a row.  -  She will stay off of metformin for now.   - She is not a good candidate for incretin therapy, SGLT2 inhibitors . Her anti-GAD and anti-islet cell antibodies are negative.  2) BP/HTN:  -Her blood pressure is  not controlled to target.  She is advised to increase her lisinopril to 20 mg p.o. daily at breakfast.  She also takes verapamil 240 mg  p.o. daily.   3) Lipids/HPL: Her recent lipid panel showed improved LDL to 92 from 139.  She was recently reinitiated on atorvastatin 10 mg p.o. nightly.    She is advised to continue.     4)  Weight/Diet:  Her BMI is 34.6-complicating her diabetes care.  She is a candidate for modest weight loss.  CDE consult in progress, exercise, and carbohydrates information provided. -Her thyroid function test is within normal limits.      5) Chronic Care/Health Maintenance:  -Patient is on ACEI/ARB and Statin medications and encouraged to continue to follow up with Ophthalmology, Podiatrist at least yearly or according to recommendations, and advised to  stay away from smoking. I have recommended yearly flu vaccine and pneumonia vaccination at least every 5 years; moderate intensity exercise for up to 150 minutes weekly; and  sleep for at least 7 hours a day.   Her screening ABI was normal in December 2021, her next study will be due in December 2026, or sooner if needed.   - I advised patient to maintain close follow up with her PCP for primary care needs.    I spent 41 minutes in the care of the patient today including review of labs from [place], Lipids, Thyroid Function, Hematology (current and previous including abstractions from other facilities); face-to-face time discussing  her blood glucose readings/logs, discussing hypoglycemia and hyperglycemia episodes and symptoms, medications doses, her options of short and long term treatment based on the latest standards of care / guidelines;  discussion about incorporating lifestyle medicine;  and documenting the encounter.    Please refer to Patient Instructions for Blood Glucose Monitoring and Insulin/Medications Dosing Guide"  in media tab for additional information. Please  also refer to " Patient Self Inventory" in  the Media  tab for reviewed elements of pertinent patient history.  Taylor Wilkins participated in the discussions, expressed understanding, and voiced agreement with the above plans.  All questions were answered to her satisfaction. she is encouraged to contact clinic should she have any questions or concerns prior to her return visit.     Follow up plan: -Return in about 6 months (around 05/01/2021) for F/U with Pre-visit Labs, Meter, Logs, A1c here.Glade Lloyd, MD Phone: [phone]  Fax: [phone]  This note was partially dictated with voice recognition software. Similar sounding words can be transcribed inadequately or may not  be corrected upon review.  10/30/2020, 12:54 PM

## 2020-10-30 NOTE — Patient Instructions (Signed)

## 2021-01-14 ENCOUNTER — Other Ambulatory Visit: Payer: Self-pay | Admitting: "Endocrinology

## 2021-01-14 DIAGNOSIS — IMO0002 Reserved for concepts with insufficient information to code with codable children: Secondary | ICD-10-CM

## 2021-01-14 DIAGNOSIS — E1165 Type 2 diabetes mellitus with hyperglycemia: Secondary | ICD-10-CM

## 2021-04-17 ENCOUNTER — Encounter (INDEPENDENT_AMBULATORY_CARE_PROVIDER_SITE_OTHER): Payer: BC Managed Care – PPO | Admitting: Ophthalmology

## 2021-04-17 ENCOUNTER — Other Ambulatory Visit: Payer: Self-pay

## 2021-04-17 DIAGNOSIS — E113393 Type 2 diabetes mellitus with moderate nonproliferative diabetic retinopathy without macular edema, bilateral: Secondary | ICD-10-CM | POA: Diagnosis not present

## 2021-04-17 DIAGNOSIS — H3509 Other intraretinal microvascular abnormalities: Secondary | ICD-10-CM | POA: Diagnosis not present

## 2021-04-17 DIAGNOSIS — H35033 Hypertensive retinopathy, bilateral: Secondary | ICD-10-CM

## 2021-04-17 DIAGNOSIS — H43813 Vitreous degeneration, bilateral: Secondary | ICD-10-CM

## 2021-04-17 DIAGNOSIS — I10 Essential (primary) hypertension: Secondary | ICD-10-CM

## 2021-04-17 DIAGNOSIS — H35371 Puckering of macula, right eye: Secondary | ICD-10-CM

## 2021-04-25 LAB — HEPATIC FUNCTION PANEL
ALT: 14 (ref 7–35)
AST: 21 (ref 13–35)
Alkaline Phosphatase: 81 (ref 25–125)
Bilirubin, Total: 0.6

## 2021-04-25 LAB — BASIC METABOLIC PANEL
BUN: 16 (ref 4–21)
CO2: 28 — AB (ref 13–22)
Chloride: 105 (ref 99–108)
Creatinine: 0.8 (ref <=1.1)
Glucose: 132
Potassium: 5.2 (ref 3.4–5.3)
Sodium: 143 (ref 137–147)

## 2021-04-25 LAB — COMPREHENSIVE METABOLIC PANEL
Albumin: 4.3 (ref 3.5–5.0)
Calcium: 9.3 (ref 8.7–10.7)
GFR calc Af Amer: 79
GFR calc non Af Amer: 68
Globulin: 1.7

## 2021-05-01 ENCOUNTER — Ambulatory Visit: Payer: BC Managed Care – PPO | Admitting: "Endocrinology

## 2021-05-13 HISTORY — PX: CATARACT EXTRACTION: SUR2

## 2021-05-15 ENCOUNTER — Telehealth: Payer: Self-pay

## 2021-05-15 NOTE — Telephone Encounter (Signed)
Received a fax from [employer] and [employer] and its [employer] that stated this patient has been approved for the [employer] 14 day Sensor effective 05-15-2021 through 05-15-2022.

## 2021-05-17 NOTE — Telephone Encounter (Signed)
PA Case: [id number], Status: Approved, Coverage Starts on: 05/15/2021 12:00:00 AM, Coverage Ends on: 05/15/2022 12:00:00 AM.  Received approval through Cover My Meds for the [facility]

## 2021-05-31 ENCOUNTER — Encounter: Payer: Self-pay | Admitting: "Endocrinology

## 2021-05-31 ENCOUNTER — Other Ambulatory Visit: Payer: Self-pay

## 2021-05-31 ENCOUNTER — Ambulatory Visit (INDEPENDENT_AMBULATORY_CARE_PROVIDER_SITE_OTHER): Payer: BC Managed Care – PPO | Admitting: "Endocrinology

## 2021-05-31 VITALS — BP 108/58 | HR 84 | Ht 62.0 in | Wt 195.8 lb

## 2021-05-31 DIAGNOSIS — E782 Mixed hyperlipidemia: Secondary | ICD-10-CM | POA: Diagnosis not present

## 2021-05-31 DIAGNOSIS — Z794 Long term (current) use of insulin: Secondary | ICD-10-CM | POA: Diagnosis not present

## 2021-05-31 DIAGNOSIS — I10 Essential (primary) hypertension: Secondary | ICD-10-CM | POA: Diagnosis not present

## 2021-05-31 DIAGNOSIS — E1165 Type 2 diabetes mellitus with hyperglycemia: Secondary | ICD-10-CM | POA: Diagnosis not present

## 2021-05-31 LAB — POCT GLYCOSYLATED HEMOGLOBIN (HGB A1C): HbA1c, POC (controlled diabetic range): 6.9 % (ref 0.0–7.0)

## 2021-05-31 MED ORDER — FREESTYLE LIBRE 2 READER DEVI: 0 refills | Status: DC

## 2021-05-31 MED ORDER — FREESTYLE LIBRE 2 SENSOR MISC: 1.0000 | 3 refills | Status: DC

## 2021-05-31 NOTE — Progress Notes (Signed)
05/31/2021   Endocrinology follow-up note  Subjective:    Patient ID: Taylor Wilkins, female    DOB: 1946-02-06, PCP Tillman Abide, FNP   Past Medical History:  Diagnosis Date   Cancer [facility])    Basal cell on face   Chest pain 08/2008   normal coronary angiogram   Diabetes ([place])    Hyperlipemia    Hypertension    Leukemia, chronic ([place])    CLL   Sleep apnea    Past Surgical History:  Procedure Laterality Date   ABDOMINAL HYSTERECTOMY     ANKLE SURGERY  2006   TONSILLECTOMY  1957   Social History   Socioeconomic History   Marital status: Married    Spouse name: Not on file   Number of children: Not on file   Years of education: Not on file   Highest education level: Not on file  Occupational History   Not on file  Tobacco Use   Smoking status: Never   Smokeless tobacco: Never  Vaping Use   Vaping Use: Never used  Substance and Sexual Activity   Alcohol use: Never   Drug use: Never   Sexual activity: Not on file  Other Topics Concern   Not on file  Social History Narrative   Not on file   Social Determinants of Health   Financial Resource Strain: Not on file  Food Insecurity: Not on file  Transportation Needs: Not on file  Physical Activity: Not on file  Stress: Not on file  Social Connections: Not on file   Outpatient Encounter Medications as of 05/31/2021  Medication Sig   Continuous Blood Gluc Receiver (FREESTYLE LIBRE 2 READER) DEVI As directed   Continuous Blood Gluc Sensor (FREESTYLE LIBRE 2 SENSOR) MISC 1 Piece by Does not apply route every 14 (fourteen) days.   hydrALAZINE (APRESOLINE) 25 MG tablet Take 1 tablet by mouth 4 (four) times daily.   insulin lispro (HUMALOG) 200 UNIT/ML KwikPen Inject 14-17 Units into the skin 3 (three) times daily before meals.   aspirin 81 MG tablet Take 81 mg by mouth daily.   atorvastatin (LIPITOR) 10 MG tablet Take 1 tablet (10 mg total) by mouth daily.   B Complex Vitamins (VITAMIN B COMPLEX PO) Take 1  tablet by mouth daily.   B-D UF III MINI PEN NEEDLES 31G X 5 MM MISC USE 1 NEEDLE AS DIRECTED   Calcium Citrate-Vitamin D (CALCIUM + D PO) Take by mouth.   cholecalciferol (VITAMIN D) 1000 UNITS tablet Take 1,000 Units by mouth daily.   Continuous Blood Gluc Sensor (FREESTYLE LIBRE 14 DAY SENSOR) MISC CHANGE EVERY 14 DAYS   diclofenac Sodium (VOLTAREN) 1 % GEL Apply 1 application topically 4 (four) times daily.   escitalopram (LEXAPRO) 5 MG tablet Take 5 mg by mouth daily.   Fish Oil-Cholecalciferol (FISH OIL + D3) 1000-1000 MG-UNIT CAPS Take by mouth.   furosemide (LASIX) 20 MG tablet Take 20 mg by mouth daily. As needed   Ibrutinib (IMBRUVICA) 280 MG TABS Take 1 tablet by mouth daily in the afternoon.   insulin glargine, 1 Unit Dial, (TOUJEO SOLOSTAR) 300 UNIT/ML Solostar Pen Inject 50 Units into the skin at bedtime.   Insulin Pen Needle (BD PEN NEEDLE NANO U/F) 32G X 4 MM MISC 1 each by Does not apply route 4 (four) times daily.   lisinopril (ZESTRIL) 20 MG tablet Take 40 mg by mouth daily with breakfast.   Magnesium 250 MG TABS every day   pregabalin (LYRICA)  50 MG capsule Lyrica 50 mg capsule  Take 1 capsule 3 times a day by oral route.   Probiotic Product (PROBIOTIC PO) Take 1 tablet by mouth daily in the afternoon.   TURMERIC PO Take 1 tablet by mouth daily.   verapamil (VERELAN PM) 360 MG 24 hr capsule Take 360 mg by mouth at bedtime. (Patient not taking: Reported on 05/31/2021)   [DISCONTINUED] HUMALOG KWIKPEN 200 UNIT/ML KwikPen INJECT 12-15 UNITS         SUBCUTANEOUSLY 3 TIMES A   DAY BEFORE MEALS   No facility-administered encounter medications on file as of 05/31/2021.   ALLERGIES: No Known Allergies VACCINATION STATUS: Immunization History  Administered Date(s) Administered   Moderna Sars-Covid-2 Vaccination 06/09/2019, 07/07/2019    Diabetes She presents for her follow-up diabetic visit. She has type 2 diabetes mellitus. Onset time: He was diagnosed at approximate age of  8 years. Her disease course has been improving. There are no hypoglycemic associated symptoms. Pertinent negatives for hypoglycemia include no confusion, headaches, pallor or seizures. Pertinent negatives for diabetes include no chest pain, no polydipsia, no polyphagia and no polyuria. There are no hypoglycemic complications. Symptoms are improving. Diabetic complications include retinopathy. Risk factors for coronary artery disease include diabetes mellitus, dyslipidemia, hypertension and tobacco exposure. Current diabetic treatment includes insulin injections and oral agent (dual therapy). Her weight is increasing steadily. She is following a generally unhealthy diet. She has had a previous visit with a dietitian. She rarely participates in exercise. Her home blood glucose trend is decreasing steadily. Her breakfast blood glucose range is generally 140-180 mg/dl. Her lunch blood glucose range is generally 180-200 mg/dl. Her dinner blood glucose range is generally 180-200 mg/dl. Her bedtime blood glucose range is generally 180-200 mg/dl. Her overall blood glucose range is 180-200 mg/dl. (She wears a CGM presents with fluctuating glycemic profile lately.  Her AGP report shows 51% time range, 43% above range.  She has 1% hypoglycemia.  Her point-of-care A1c 6.9% increasing from 5.9% during her last visit. ) An ACE inhibitor/angiotensin II receptor blocker is being taken. Eye exam is current.  Hyperlipidemia This is a chronic problem. The current episode started more than 1 year ago. The problem is controlled. Exacerbating diseases include diabetes and obesity. Pertinent negatives include no chest pain, myalgias or shortness of breath. Current antihyperlipidemic treatment includes statins. Risk factors for coronary artery disease include diabetes mellitus, obesity, a sedentary lifestyle and post-menopausal.  Hypertension This is a chronic problem. The current episode started more than 1 year ago. The problem is  controlled. Pertinent negatives include no chest pain, headaches, palpitations or shortness of breath. Risk factors for coronary artery disease include diabetes mellitus, dyslipidemia, sedentary lifestyle, post-menopausal state and obesity. Past treatments include calcium channel blockers. Hypertensive end-organ damage includes retinopathy.    Review of systems  Limited as above.   Objective:    BP (!) 108/58    Pulse 84    Ht 5\' 2"  (1.575 m)    Wt 195 lb 12.8 oz (88.8 kg)    BMI 35.81 kg/m   Wt Readings from Last 3 Encounters:  05/31/21 195 lb 12.8 oz (88.8 kg)  10/30/20 189 lb 3.2 oz (85.8 kg)  09/15/20 188 lb (85.3 kg)        Results for orders placed or performed in visit on 05/31/21  HgB A1c  Result Value Ref Range   Hemoglobin A1C     HbA1c POC (<> result, manual entry)     HbA1c, POC (  prediabetic range)     HbA1c, POC (controlled diabetic range) 6.9 0.0 - 7.0 %   Lipid Panel     Component Value Date/Time   CHOL 163 12/10/2019 0905   TRIG 142 12/10/2019 0905   HDL 46 12/10/2019 0905   CHOLHDL 3.5 12/10/2019 0905   LDLCALC 92 12/10/2019 0905   Recent Results (from the past 2160 hour(s))  Basic metabolic panel     Status: Abnormal   Collection Time: 04/25/21 12:00 AM  Result Value Ref Range   Glucose 132    BUN 16 4 - 21   CO2 28 (A) 13 - 22   Creatinine 0.8 0.5 - 1.1   Potassium 5.2 3.4 - 5.3   Sodium 143 137 - 147   Chloride 105 99 - 108  Comprehensive metabolic panel     Status: None   Collection Time: 04/25/21 12:00 AM  Result Value Ref Range   Globulin 1.7    GFR calc Af Amer 79    GFR calc non Af Amer 68    Calcium 9.3 8.7 - 10.7   Albumin 4.3 3.5 - 5.0  Hepatic function panel     Status: None   Collection Time: 04/25/21 12:00 AM  Result Value Ref Range   Alkaline Phosphatase 81 25 - 125   ALT 14 7 - 35   AST 21 13 - 35   Bilirubin, Total 0.6   HgB A1c     Status: None   Collection Time: 05/31/21 10:19 AM  Result Value Ref Range    Hemoglobin A1C     HbA1c POC (<> result, manual entry)     HbA1c, POC (prediabetic range)     HbA1c, POC (controlled diabetic range) 6.9 0.0 - 7.0 %    Assessment & Plan:   1. Uncontrolled type 2 diabetes mellitus with complication.   She wears a CGM presents with fluctuating glycemic profile lately.  Her AGP report shows 51% time range, 43% above range.  She has 1% hypoglycemia.  Her point-of-care A1c 6.9% increasing from 5.9% during her last visit.    Patient remains at a high risk for more acute and chronic complications of diabetes which include CAD, CVA, CKD, retinopathy, and neuropathy. These are all discussed in detail with the patient. Glucose logs and insulin administration records pertaining to this visit,  to be scanned into patient's records.  Recent labs reviewed.   - I have re-counseled the patient on diet management  by adopting a carbohydrate restricted / protein rich  Diet.  - she acknowledges that there is a room for improvement in her food and drink choices. - Suggestion is made for her to avoid simple carbohydrates  from her diet including Cakes, Sweet Desserts, Ice Cream, Soda (diet and regular), Sweet Tea, Candies, Chips, Cookies, Store Bought Juices, Alcohol , Artificial Sweeteners,  Coffee Creamer, and "Sugar-free" Products, Lemonade. This will help patient to have more stable blood glucose profile and potentially avoid unintended weight gain.  The following Lifestyle Medicine recommendations according to [facility]) were discussed and and offered to patient and she  agrees to start the journey:  A. Whole Foods, Plant-Based Nutrition comprising of fruits and vegetables, plant-based proteins, whole-grain carbohydrates was discussed in detail with the patient.   A list for source of those nutrients were also provided to the patient.  Patient will use only water or unsweetened tea for hydration. B.  The need to stay away from risky  substances including alcohol, smoking; obtaining 7 to 9 hours of restorative sleep, at least 150 minutes of moderate intensity exercise weekly, the importance of healthy social connections,  and stress management techniques were discussed. C.  A full color page of  Calorie density of various food groups per pound showing examples of each food groups was provided to the patient.   - Patient is advised to stick to a routine mealtimes to eat 3 meals  a day and avoid unnecessary snacks ( to snack only to correct hypoglycemia).   - I have approached patient with the following individualized plan to manage diabetes and patient agrees.  - She we will continue to need intensive treatment with basal/bolus insulin in order for her to achieve and maintain control of diabetes to target.   She needs more coverage for her postprandial hyperglycemia.  -To avoid hypoglycemia is the priority in her care.   -I advised her to continue Toujeo 50 units nightly, advised to increase Humalog to 14   units 3 times a day before meals for pre-meal glucose above 90 mg/dL,  associated with strict documenting blood glucose at least 4 times a day-before meals and at bedtime.   -She is wearing the Libre CGM device-advised to wear it at all times.    -Patient is encouraged to call clinic for blood glucose levels less than 70 or above 200 mg /dl x 3 readings in a row.  -  She will stay off of metformin for now.   - She is not a good candidate for incretin therapy, SGLT2 inhibitors . Her anti-GAD and anti-islet cell antibodies are negative.  2) BP/HTN:  -Her blood pressure is controlled to target.  She is advised to increase her lisinopril to 20 mg p.o. daily at breakfast.  She also takes verapamil 240 mg p.o. daily.   3) Lipids/HPL: Her recent lipid panel showed improved LDL to 92 from 139.  She was recently reinitiated on atorvastatin 10 mg p.o. nightly.    She is advised to continue.     4)  Weight/Diet:  Her BMI is  03.8-BFXOVAN complicating her diabetes care.    She is a candidate for modest weight loss.  CDE consult in progress, exercise, and carbohydrates information provided. -Her thyroid function test is within normal limits.      5) Chronic Care/Health Maintenance:  -Patient is on ACEI/ARB and Statin medications and encouraged to continue to follow up with Ophthalmology, Podiatrist at least yearly or according to recommendations, and advised to  stay away from smoking. I have recommended yearly flu vaccine and pneumonia vaccination at least every 5 years; moderate intensity exercise for up to 150 minutes weekly; and  sleep for at least 7 hours a day.   Her screening ABI was normal in December 2021, her next study will be due in December 2026, or sooner if needed.   - I advised patient to maintain close follow up with her PCP for primary care needs.   I spent 44 minutes in the care of the patient today including review of labs from [place], Lipids, Thyroid Function, Hematology (current and previous including abstractions from other facilities); face-to-face time discussing  her blood glucose readings/logs, discussing hypoglycemia and hyperglycemia episodes and symptoms, medications doses, her options of short and long term treatment based on the latest standards of care / guidelines;  discussion about incorporating lifestyle medicine;  and documenting the encounter.    Please refer to Patient Instructions for Blood Glucose Monitoring and  Insulin/Medications Dosing Guide"  in media tab for additional information. Please  also refer to " Patient Self Inventory" in the Media  tab for reviewed elements of pertinent patient history.  Taylor Wilkins participated in the discussions, expressed understanding, and voiced agreement with the above plans.  All questions were answered to her satisfaction. she is encouraged to contact clinic should she have any questions or concerns prior to her return  visit.    Follow up plan: -Return in about 6 months (around 11/28/2021) for F/U with Pre-visit Labs, Meter, Logs, A1c here.Glade Lloyd, MD Phone: [phone]  Fax: [phone]  This note was partially dictated with voice recognition software. Similar sounding words can be transcribed inadequately or may not  be corrected upon review.  05/31/2021, 1:35 PM

## 2021-05-31 NOTE — Patient Instructions (Signed)

## 2021-07-19 ENCOUNTER — Other Ambulatory Visit: Payer: Self-pay | Admitting: "Endocrinology

## 2021-08-07 ENCOUNTER — Other Ambulatory Visit: Payer: Self-pay | Admitting: "Endocrinology

## 2021-10-12 ENCOUNTER — Telehealth: Payer: Self-pay | Admitting: "Endocrinology

## 2021-10-12 NOTE — Telephone Encounter (Signed)
Taylor Wilkins with [employer] Fee Billing with [facility] is asking for a call back [phone]. This is in regards to [name] that was done in 2021

## 2021-10-14 ENCOUNTER — Other Ambulatory Visit: Payer: Self-pay | Admitting: "Endocrinology

## 2021-10-16 ENCOUNTER — Encounter: Payer: Self-pay | Admitting: Cardiovascular Disease

## 2021-10-16 ENCOUNTER — Ambulatory Visit (INDEPENDENT_AMBULATORY_CARE_PROVIDER_SITE_OTHER): Payer: BC Managed Care – PPO | Admitting: Cardiovascular Disease

## 2021-10-16 ENCOUNTER — Encounter (INDEPENDENT_AMBULATORY_CARE_PROVIDER_SITE_OTHER): Payer: BC Managed Care – PPO | Admitting: Ophthalmology

## 2021-10-16 VITALS — BP 148/70 | HR 75 | Ht 62.0 in | Wt 199.4 lb

## 2021-10-16 DIAGNOSIS — E782 Mixed hyperlipidemia: Secondary | ICD-10-CM

## 2021-10-16 DIAGNOSIS — I10 Essential (primary) hypertension: Secondary | ICD-10-CM | POA: Diagnosis not present

## 2021-10-16 NOTE — Progress Notes (Addendum)
10/16/2021 Taylor Wilkins   02-Feb-1946  [id number]  Primary Physician Tillman Abide, [place] Primary Cardiologist: Lorretta Harp MD [name], [place]  HPI:  Taylor Wilkins is a 76 y.o.  married Caucasian female mother of 37, grandmother and 6 grandchildren referred by Leandrew Koyanagi FNP in [place] to be reestablished our practice. I last saw her in the office   09/15/2020. She had previously seen Dr. Elisabeth Cara in our practice years ago and apparently had a normal cardiac catheterization after false positive GXT. 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  She has cardiac risk factors notable for 2 hypertension and hyperlipidemia as well as diabetes. She does not smoke. Her mother did die of a myocardial infarction at age 37. She had new onset dyspnea on exertion over several months prior to her last office visit which prompted obtaining a 2-D echo and a Myoview stress test on 11/08/16 which were normal. Her symptoms have since resolved. She has had CLL for last 3 years and has recently finished chemotherapy for this.  This resulted in her beginning insulin and steroids which has apparently led to a 15 pound weight gain.   Since I saw her over a year ago she is remained stable.  Her CLL has remained stable.  She has required some blood transfusions however. She is very active, denies chest pain or shortness of breath.     Current Meds  Medication Sig   aspirin 81 MG tablet Take 81 mg by mouth daily.   atorvastatin (LIPITOR) 10 MG tablet Take 1 tablet (10 mg total) by mouth daily.   B Complex Vitamins (VITAMIN B COMPLEX PO) Take 1 tablet by mouth daily.   B-D UF III MINI PEN NEEDLES 31G X 5 MM MISC USE 1 NEEDLE AS DIRECTED   Calcium Citrate-Vitamin D (CALCIUM + D PO) Take by mouth.   cholecalciferol (VITAMIN D) 1000 UNITS tablet Take 1,000 Units by mouth daily.   Continuous Blood Gluc Receiver (FREESTYLE LIBRE 2 READER) DEVI USE AS  DIRECTED   Continuous Blood Gluc Sensor (FREESTYLE LIBRE 14 DAY SENSOR) MISC CHANGE EVERY 14 DAYS   Continuous Blood Gluc Sensor (FREESTYLE LIBRE 2 SENSOR) MISC USE TO CHECK BLOOD SUGAR FOR 14 DAYS   diclofenac Sodium (VOLTAREN) 1 % GEL Apply 1 application topically 4 (four) times daily.   escitalopram (LEXAPRO) 5 MG tablet Take 5 mg by mouth daily.   Fish Oil-Cholecalciferol (FISH OIL + D3) 1000-1000 MG-UNIT CAPS Take by mouth.   furosemide (LASIX) 20 MG tablet Take 20 mg by mouth daily. As needed   hydrALAZINE (APRESOLINE) 25 MG tablet Take 1 tablet by mouth 4 (four) times daily.   Ibrutinib (IMBRUVICA) 280 MG TABS Take 1 tablet by mouth daily in the afternoon.   insulin glargine, 1 Unit Dial, (TOUJEO SOLOSTAR) 300 UNIT/ML Solostar Pen Inject 50 Units into the skin at bedtime.   insulin lispro (HUMALOG) 200 UNIT/ML KwikPen Inject 14-17 Units into the skin 3 (three) times daily before meals.   Insulin Pen Needle (BD PEN NEEDLE NANO U/F) 32G X 4 MM MISC 1 each by Does not apply route 4 (four) times daily.   lisinopril (ZESTRIL) 20 MG tablet Take 40 mg by mouth daily with breakfast.   Magnesium 250 MG TABS every day   pregabalin (LYRICA) 50 MG capsule Lyrica 50 mg capsule  Take 1 capsule 3 times a day by oral route.  Probiotic Product (PROBIOTIC PO) Take 1 tablet by mouth daily in the afternoon.   TOUJEO MAX SOLOSTAR 300 UNIT/ML Solostar Pen INJECT 50 UNITS UNDER THE SKIN AT BEDTIME   TURMERIC PO Take 1 tablet by mouth daily.   verapamil (VERELAN PM) 360 MG 24 hr capsule Take 360 mg by mouth at bedtime.     No Known Allergies  Social History   Socioeconomic History   Marital status: Married    Spouse name: Not on file   Number of children: Not on file   Years of education: Not on file   Highest education level: Not on file  Occupational History   Not on file  Tobacco Use   Smoking status: Never   Smokeless tobacco: Never  Vaping Use   Vaping Use: Never used  Substance and  Sexual Activity   Alcohol use: Never   Drug use: Never   Sexual activity: Not on file  Other Topics Concern   Not on file  Social History Narrative   Not on file   Social Determinants of Health   Financial Resource Strain: Not on file  Food Insecurity: Not on file  Transportation Needs: Not on file  Physical Activity: Not on file  Stress: Not on file  Social Connections: Not on file  Intimate Partner Violence: Not on file     Review of Systems: General: negative for chills, fever, night sweats or weight changes.  Cardiovascular: negative for chest pain, dyspnea on exertion, edema, orthopnea, palpitations, paroxysmal nocturnal dyspnea or shortness of breath Dermatological: negative for rash Respiratory: negative for cough or wheezing Urologic: negative for hematuria Abdominal: negative for nausea, vomiting, diarrhea, bright red blood per rectum, melena, or hematemesis Neurologic: negative for visual changes, syncope, or dizziness All other systems reviewed and are otherwise negative except as noted above.    Blood pressure (!) 150/70, pulse 75, height '5\' 2"'$  (1.575 m), weight 199 lb 6.4 oz (90.4 kg), SpO2 95 %.  General appearance: alert and no distress Neck: no adenopathy, no carotid bruit, no JVD, supple, symmetrical, trachea midline, and thyroid not enlarged, symmetric, no tenderness/mass/nodules Lungs: clear to auscultation bilaterally Heart: regular rate and rhythm, S1, S2 normal, no murmur, click, rub or gallop Extremities: extremities normal, atraumatic, no cyanosis or edema Pulses: 2+ and symmetric Skin: Skin color, texture, turgor normal. No rashes or lesions Neurologic: Grossly normal  EKG sinus rhythm at 75 without ST or T wave changes.  I personally reviewed this EKG.  ASSESSMENT AND PLAN:   Mixed hyperlipidemia History of hyperlipidemia on statin therapy followed by her PCP.  Essential hypertension, benign History of essential hypertension a blood pressure  measured today at 150/70.  She is on hydralazine and lisinopril as well as verapamil.     Lorretta Harp MD FACP,FACC,FAHA, [facility] 10/16/2021 9:32 AM

## 2021-10-16 NOTE — Patient Instructions (Signed)

## 2021-10-16 NOTE — Assessment & Plan Note (Signed)
History of essential hypertension a blood pressure measured today at 150/70.  She is on hydralazine and lisinopril as well as verapamil.

## 2021-10-16 NOTE — Telephone Encounter (Signed)
Left a message requesting a return call to the office. 

## 2021-10-16 NOTE — Assessment & Plan Note (Signed)
History of hyperlipidemia on statin therapy followed by her PCP. 

## 2021-10-17 ENCOUNTER — Encounter (INDEPENDENT_AMBULATORY_CARE_PROVIDER_SITE_OTHER): Payer: BC Managed Care – PPO | Admitting: Ophthalmology

## 2021-10-17 DIAGNOSIS — I10 Essential (primary) hypertension: Secondary | ICD-10-CM | POA: Diagnosis not present

## 2021-10-17 DIAGNOSIS — H35033 Hypertensive retinopathy, bilateral: Secondary | ICD-10-CM

## 2021-10-17 DIAGNOSIS — H3509 Other intraretinal microvascular abnormalities: Secondary | ICD-10-CM

## 2021-10-17 DIAGNOSIS — H43813 Vitreous degeneration, bilateral: Secondary | ICD-10-CM

## 2021-10-17 DIAGNOSIS — H35371 Puckering of macula, right eye: Secondary | ICD-10-CM

## 2021-10-17 DIAGNOSIS — H2513 Age-related nuclear cataract, bilateral: Secondary | ICD-10-CM

## 2021-10-17 DIAGNOSIS — E113393 Type 2 diabetes mellitus with moderate nonproliferative diabetic retinopathy without macular edema, bilateral: Secondary | ICD-10-CM

## 2021-11-05 ENCOUNTER — Encounter: Payer: Self-pay | Admitting: Cardiovascular Disease

## 2021-11-20 LAB — HEPATIC FUNCTION PANEL
ALT: 16 U/L (ref 7–35)
AST: 17 (ref 13–35)
Alkaline Phosphatase: 75 (ref 25–125)
Bilirubin, Total: 0.5

## 2021-11-20 LAB — BASIC METABOLIC PANEL
BUN: 17 (ref 4–21)
CO2: 22 (ref 13–22)
Chloride: 105 (ref 99–108)
Creatinine: 0.8 (ref 0.5–1.1)
Glucose: 201
Potassium: 5.4 mEq/L — AB (ref 3.5–5.1)
Sodium: 142 (ref 137–147)

## 2021-11-20 LAB — COMPREHENSIVE METABOLIC PANEL
Albumin: 4 (ref 3.5–5.0)
Calcium: 9.2 (ref 8.7–10.7)
Globulin: 1.9

## 2021-11-20 LAB — LIPID PANEL
Cholesterol: 164 (ref 0–200)
HDL: 49 (ref 35–70)
LDL Cholesterol: 89
Triglycerides: 148 (ref 40–160)

## 2021-11-20 LAB — TSH: TSH: 2.92 (ref 0.41–5.90)

## 2021-11-28 ENCOUNTER — Encounter: Payer: Self-pay | Admitting: "Endocrinology

## 2021-11-28 ENCOUNTER — Ambulatory Visit (INDEPENDENT_AMBULATORY_CARE_PROVIDER_SITE_OTHER): Payer: Medicare Other | Admitting: "Endocrinology

## 2021-11-28 VITALS — BP 128/66 | HR 76 | Ht 62.0 in | Wt 198.2 lb

## 2021-11-28 DIAGNOSIS — I10 Essential (primary) hypertension: Secondary | ICD-10-CM

## 2021-11-28 DIAGNOSIS — E782 Mixed hyperlipidemia: Secondary | ICD-10-CM | POA: Diagnosis not present

## 2021-11-28 DIAGNOSIS — E1165 Type 2 diabetes mellitus with hyperglycemia: Secondary | ICD-10-CM | POA: Diagnosis not present

## 2021-11-28 DIAGNOSIS — Z6836 Body mass index (BMI) 36.0-36.9, adult: Secondary | ICD-10-CM

## 2021-11-28 DIAGNOSIS — Z794 Long term (current) use of insulin: Secondary | ICD-10-CM | POA: Diagnosis not present

## 2021-11-28 LAB — POCT GLYCOSYLATED HEMOGLOBIN (HGB A1C): HbA1c, POC (controlled diabetic range): 6.6 % (ref 0.0–7.0)

## 2021-11-28 NOTE — Patient Instructions (Signed)
                                     Advice for Weight Management  -For most of us the best way to lose weight is by diet management. Generally speaking, diet management means consuming less calories intentionally which over time brings about progressive weight loss.  This can be achieved more effectively by avoiding ultra processed carbohydrates, processed meats, unhealthy fats.    It is critically important to know your numbers: how much calorie you are consuming and how much calorie you need. More importantly, our carbohydrates sources should be unprocessed naturally occurring  complex starch food items.  It is always important to balance nutrition also by  appropriate intake of proteins (mainly plant-based), healthy fats/oils, plenty of fruits and vegetables.   -The American College of Lifestyle Medicine (ACL M) recommends nutrition derived mostly from Whole Food, Plant Predominant Sources example an apple instead of applesauce or apple pie. Eat Plenty of vegetables, Mushrooms, fruits, Legumes, Whole Grains, Nuts, seeds in lieu of processed meats, processed snacks/pastries red meat, poultry, eggs.  Use only water or unsweetened tea for hydration.  The College also recommends the need to stay away from risky substances including alcohol, smoking; obtaining 7-9 hours of restorative sleep, at least 150 minutes of moderate intensity exercise weekly, importance of healthy social connections, and being mindful of stress and seek help when it is overwhelming.    -Sticking to a routine mealtime to eat 3 meals a day and avoiding unnecessary snacks is shown to have a big role in weight control. Under normal circumstances, the only time we burn stored energy is when we are hungry, so allow  some hunger to take place- hunger means no food between appropriate meal times, only water.  It is not advisable to starve.   -It is better to avoid simple carbohydrates including:  Cakes, Sweet Desserts, Ice Cream, Soda (diet and regular), Sweet Tea, Candies, Chips, Cookies, Store Bought Juices, Alcohol in Excess of  1-2 drinks a day, Lemonade,  Artificial Sweeteners, Doughnuts, Coffee Creamers, "Sugar-free" Products, etc, etc.  This is not a complete list.....    -Consulting with certified diabetes educators is proven to provide you with the most accurate and current information on diet.  Also, you may be  interested in discussing diet options/exchanges , we can schedule a visit with Taylor Wilkins, RDN, CDE for individualized nutrition education.  -Exercise: If you are able: 30 -60 minutes a day ,4 days a week, or 150 minutes of moderate intensity exercise weekly.    The longer the better if tolerated.  Combine stretch, strength, and aerobic activities.  If you were told in the past that you have high risk for cardiovascular diseases, or if you are currently symptomatic, you may seek evaluation by your heart doctor prior to initiating moderate to intense exercise programs.                                  Additional Care Considerations for Diabetes/Prediabetes   -Diabetes  is a chronic disease.  The most important care consideration is regular follow-up with your diabetes care provider with the goal being avoiding or delaying its complications and to take advantage of advances in medications and technology.  If appropriate actions are taken early enough, type 2 diabetes can even be   reversed.  Seek information from the right source.  - Whole Food, Plant Predominant Nutrition is highly recommended: Eat Plenty of vegetables, Mushrooms, fruits, Legumes, Whole Grains, Nuts, seeds in lieu of processed meats, processed snacks/pastries red meat, poultry, eggs as recommended by American College of  Lifestyle Medicine (ACLM).  -Type 2 diabetes is known to coexist with other important comorbidities such as high blood pressure and high cholesterol.  It is critical to control not only the  diabetes but also the high blood pressure and high cholesterol to minimize and delay the risk of complications including coronary artery disease, stroke, amputations, blindness, etc.  The good news is that this diet recommendation for type 2 diabetes is also very helpful for managing high cholesterol and high blood blood pressure.  - Studies showed that people with diabetes will benefit from a class of medications known as ACE inhibitors and statins.  Unless there are specific reasons not to be on these medications, the standard of care is to consider getting one from these groups of medications at an optimal doses.  These medications are generally considered safe and proven to help protect the heart and the kidneys.    - People with diabetes are encouraged to initiate and maintain regular follow-up with eye doctors, foot doctors, dentists , and if necessary heart and kidney doctors.     - It is highly recommended that people with diabetes quit smoking or stay away from smoking, and get yearly  flu vaccine and pneumonia vaccine at least every 5 years.  See above for additional recommendations on exercise, sleep, stress management , and healthy social connections.      

## 2021-11-28 NOTE — Progress Notes (Signed)
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   Sleep apnea    Past Surgical History:  Procedure Laterality Date   ABDOMINAL HYSTERECTOMY     ANKLE SURGERY  2006   TONSILLECTOMY  1957   Social History   Socioeconomic History   Marital status: Married    Spouse name: Not on file   Number of children: Not on file   Years of education: Not on file   Highest education level: Not on file  Occupational History   Not on file  Tobacco Use   Smoking status: Never   Smokeless tobacco: Never  Vaping Use   Vaping Use: Never used  Substance and Sexual Activity   Alcohol use: Never   Drug use: Never   Sexual activity: Not on file  Other Topics Concern   Not on file  Social History Narrative   Not on file   Social Determinants of Health   Financial Resource Strain: Not on file  Food Insecurity: Not on file  Transportation Needs: Not on file  Physical Activity: Not on file  Stress: Not on file  Social Connections: Not on file   Outpatient Encounter Medications as of 11/28/2021  Medication Sig   aspirin 81 MG tablet Take 81 mg by mouth daily.   atorvastatin (LIPITOR) 10 MG tablet Take 1 tablet (10 mg total) by mouth daily.   B Complex Vitamins (VITAMIN B COMPLEX PO) Take 1 tablet by mouth daily.   B-D UF III MINI PEN NEEDLES 31G X 5 MM MISC USE 1 NEEDLE AS DIRECTED   Calcium Citrate-Vitamin D (CALCIUM + D PO) Take by mouth.   cholecalciferol (VITAMIN D) 1000 UNITS tablet Take 1,000 Units by mouth daily.   Continuous Blood Gluc Receiver (FREESTYLE LIBRE 2 READER) DEVI USE AS DIRECTED   Continuous Blood Gluc Sensor (FREESTYLE LIBRE 14 DAY SENSOR) MISC CHANGE EVERY 14  DAYS   Continuous Blood Gluc Sensor (FREESTYLE LIBRE 2 SENSOR) MISC USE TO CHECK BLOOD SUGAR FOR 14 DAYS   diclofenac Sodium (VOLTAREN) 1 % GEL Apply 1 application topically 4 (four) times daily.   escitalopram (LEXAPRO) 5 MG tablet Take 5 mg by mouth daily.   Fish Oil-Cholecalciferol (FISH OIL + D3) 1000-1000 MG-UNIT CAPS Take by mouth.   furosemide (LASIX) 20 MG tablet Take 20 mg by mouth daily. As needed   hydrALAZINE (APRESOLINE) 25 MG tablet Take 1 tablet by mouth 4 (four) times daily.   Ibrutinib (IMBRUVICA) 280 MG TABS Take 1 tablet by mouth daily in the afternoon.   insulin glargine, 1 Unit Dial, (TOUJEO SOLOSTAR) 300 UNIT/ML Solostar Pen Inject 50 Units into the skin at bedtime. (Patient taking differently: Inject 52 Units into the skin at bedtime.)   insulin lispro (HUMALOG) 200 UNIT/ML KwikPen Inject 14-17 Units into the skin 3 (three) times daily before meals.   Insulin Pen Needle (BD PEN NEEDLE NANO U/F) 32G X 4 MM MISC 1 each by Does not apply route 4 (four) times daily.   lisinopril (ZESTRIL) 20 MG tablet Take 40 mg by mouth daily with breakfast.   Magnesium  250 MG TABS every day   pregabalin (LYRICA) 50 MG capsule Lyrica 50 mg capsule  Take 1 capsule 3 times a day by oral route.   Probiotic Product (PROBIOTIC PO) Take 1 tablet by mouth daily in the afternoon.   TOUJEO MAX SOLOSTAR 300 UNIT/ML Solostar Pen INJECT 50 UNITS UNDER THE SKIN AT BEDTIME   TURMERIC PO Take 1 tablet by mouth daily.   [DISCONTINUED] verapamil (VERELAN PM) 360 MG 24 hr capsule Take 360 mg by mouth at bedtime.   No facility-administered encounter medications on file as of 11/28/2021.   ALLERGIES: No Known Allergies VACCINATION STATUS: Immunization History  Administered Date(s) Administered   Moderna Sars-Covid-2 Vaccination 06/09/2019, 07/07/2019    Diabetes She presents for her follow-up diabetic visit. She has type 2 diabetes mellitus. Onset time: He was diagnosed at approximate age of 8 years.  Her disease course has been stable. There are no hypoglycemic associated symptoms. Pertinent negatives for hypoglycemia include no confusion, headaches, pallor or seizures. Pertinent negatives for diabetes include no chest pain, no polydipsia, no polyphagia and no polyuria. There are no hypoglycemic complications. Symptoms are improving. Diabetic complications include retinopathy. Risk factors for coronary artery disease include diabetes mellitus, dyslipidemia, hypertension and tobacco exposure. Current diabetic treatment includes insulin injections and oral agent (dual therapy). Her weight is fluctuating minimally. She is following a generally unhealthy diet. She has had a previous visit with a dietitian. She rarely participates in exercise. Her home blood glucose trend is fluctuating minimally. Her breakfast blood glucose range is generally 140-180 mg/dl. Her lunch blood glucose range is generally 140-180 mg/dl. Her dinner blood glucose range is generally 140-180 mg/dl. Her bedtime blood glucose range is generally 140-180 mg/dl. Her overall blood glucose range is 140-180 mg/dl. (She wears a freestyle libre device.  Her device was downloaded and analyzed.  She has 55% time range, 23% level 1 hyperglycemia, 20% level 2 hyperglycemia.  She has no significant hypoglycemia.  Her point-of-care A1c was 6.6%.  This is an overall improvement.  ) An ACE inhibitor/angiotensin II receptor blocker is being taken. Eye exam is current.  Hyperlipidemia This is a chronic problem. The current episode started more than 1 year ago. The problem is controlled. Exacerbating diseases include diabetes and obesity. Pertinent negatives include no chest pain, myalgias or shortness of breath. Current antihyperlipidemic treatment includes statins. Risk factors for coronary artery disease include diabetes mellitus, obesity, a sedentary lifestyle and post-menopausal.  Hypertension This is a chronic problem. The current episode started more  than 1 year ago. The problem is controlled. Pertinent negatives include no chest pain, headaches, palpitations or shortness of breath. Risk factors for coronary artery disease include diabetes mellitus, dyslipidemia, sedentary lifestyle, post-menopausal state and obesity. Past treatments include calcium channel blockers. Hypertensive end-organ damage includes retinopathy.     Review of systems  Limited as above.   Objective:    BP 128/66   Pulse 76   Ht '5\' 2"'$  (1.575 m)   Wt 198 lb 3.2 oz (89.9 kg)   BMI 36.25 kg/m   Wt Readings from Last 3 Encounters:  11/28/21 198 lb 3.2 oz (89.9 kg)  10/16/21 199 lb 6.4 oz (90.4 kg)  05/31/21 195 lb 12.8 oz (88.8 kg)        Results for orders placed or performed in visit on 11/28/21  HgB A1c  Result Value Ref Range   Hemoglobin A1C     HbA1c POC (<> result, manual entry)     HbA1c, POC (prediabetic  range)     HbA1c, POC (controlled diabetic range) 6.6 0.0 - 7.0 %   Lipid Panel     Component Value Date/Time   CHOL 164 11/20/2021 0000   CHOL 163 12/10/2019 0905   TRIG 148 11/20/2021 0000   HDL 49 11/20/2021 0000   HDL 46 12/10/2019 0905   CHOLHDL 3.5 12/10/2019 0905   LDLCALC 89 11/20/2021 0000   LDLCALC 92 12/10/2019 0905   Recent Results (from the past 2160 hour(s))  Basic metabolic panel     Status: Abnormal   Collection Time: 11/20/21 12:00 AM  Result Value Ref Range   Glucose 201    BUN 17 4 - 21   CO2 22 13 - 22   Creatinine 0.8 0.5 - 1.1   Potassium 5.4 (A) 3.5 - 5.1 mEq/L   Sodium 142 137 - 147   Chloride 105 99 - 108  Comprehensive metabolic panel     Status: None   Collection Time: 11/20/21 12:00 AM  Result Value Ref Range   Globulin 1.9    Calcium 9.2 8.7 - 10.7   Albumin 4.0 3.5 - 5.0  Lipid panel     Status: None   Collection Time: 11/20/21 12:00 AM  Result Value Ref Range   Triglycerides 148 40 - 160   Cholesterol 164 0 - 200   HDL 49 35 - 70   LDL Cholesterol 89   Hepatic function panel     Status:  None   Collection Time: 11/20/21 12:00 AM  Result Value Ref Range   Alkaline Phosphatase 75 25 - 125   ALT 16 7 - 35 U/L   AST 17 13 - 35   Bilirubin, Total 0.5   TSH     Status: None   Collection Time: 11/20/21 12:00 AM  Result Value Ref Range   TSH 2.92 0.41 - 5.90    Comment: T4,Free 1.29  HgB A1c     Status: None   Collection Time: 11/28/21 10:30 AM  Result Value Ref Range   Hemoglobin A1C     HbA1c POC (<> result, manual entry)     HbA1c, POC (prediabetic range)     HbA1c, POC (controlled diabetic range) 6.6 0.0 - 7.0 %    Assessment & Plan:   1. Uncontrolled type 2 diabetes mellitus with complication.   She wears a freestyle libre device.  Her device was downloaded and analyzed.  She has 55% time range, 23% level 1 hyperglycemia, 20% level 2 hyperglycemia.  She has no significant hypoglycemia.  Her point-of-care A1c was 6.6%.  This is an overall improvement.     Patient remains at a high risk for more acute and chronic complications of diabetes which include CAD, CVA, CKD, retinopathy, and neuropathy. These are all discussed in detail with the patient. Glucose logs and insulin administration records pertaining to this visit,  to be scanned into patient's records.  Recent labs reviewed.   - I have re-counseled the patient on diet management  by adopting a carbohydrate restricted / protein rich  Diet.  - she acknowledges that there is a room for improvement in her food and drink choices. - Suggestion is made for her to avoid simple carbohydrates  from her diet including Cakes, Sweet Desserts, Ice Cream, Soda (diet and regular), Sweet Tea, Candies, Chips, Cookies, Store Bought Juices, Alcohol in Excess of  1-2 drinks a day, Artificial Sweeteners,  Coffee Creamer, and "Sugar-free" Products, Lemonade. This will help patient to have more  stable blood glucose profile and potentially avoid unintended weight gain.  - Patient is advised to stick to a routine mealtimes to eat 3 meals   a day and avoid unnecessary snacks ( to snack only to correct hypoglycemia).   - I have approached patient with the following individualized plan to manage diabetes and patient agrees.  - She we will continue to need intensive treatment with basal/bolus insulin in order for her to maintain control of diabetes to target.    She needs more coverage for her postprandial hyperglycemia.  -To avoid hypoglycemia is the priority in her care.   -I discussed and increase her Toujeo to 54 units nightly, continue Humalog 14-17  units 3 times a day before meals for pre-meal glucose above 90 mg/dL,  associated with strict documenting blood glucose at least 4 times a day-before meals and at bedtime.   -She is wearing the Libre CGM device-advised to wear it at all times.    -Patient is encouraged to call clinic for blood glucose levels less than 70 or above 200 mg /dl x 3 readings in a row.  -  She will stay off of metformin for now.   - She is not a good candidate for incretin therapy, SGLT2 inhibitors . Her anti-GAD and anti-islet cell antibodies are negative.  2) BP/HTN:  -Her blood pressure is controlled to target.  She is advised to increase her lisinopril to 20 mg p.o. daily at breakfast.  She also takes verapamil 240 mg p.o. daily.   3) Lipids/HPL: Her recent lipid panel showed improved LDL to 92 from 139.  She was recently reinitiated on atorvastatin 10 mg p.o. nightly.      She is advised to continue.     4)  Weight/Diet:  Her BMI is 36.25--complicating her diabetes care.  She is a candidate for modest weight loss.  CDE consult in progress, exercise, and carbohydrates information provided. -Her thyroid function test is within normal limits.      5) Chronic Care/Health Maintenance:  -Patient is on ACEI/ARB and Statin medications and encouraged to continue to follow up with Ophthalmology, Podiatrist at least yearly or according to recommendations, and advised to  stay away from smoking. I  have recommended yearly flu vaccine and pneumonia vaccination at least every 5 years; moderate intensity exercise for up to 150 minutes weekly; and  sleep for at least 7 hours a day.   Her screening ABI was normal in December 2021, her next study will be due in December 2026, or sooner if needed.   - I advised patient to maintain close follow up with her PCP for primary care needs.   I spent 33 minutes in the care of the patient today including review of labs from [place], Lipids, Thyroid Function, Hematology (current and previous including abstractions from other facilities); face-to-face time discussing  her blood glucose readings/logs, discussing hypoglycemia and hyperglycemia episodes and symptoms, medications doses, her options of short and long term treatment based on the latest standards of care / guidelines;  discussion about incorporating lifestyle medicine;  and documenting the encounter. Risk reduction counseling performed per USPSTF guidelines to reduce obesity and cardiovascular risk factors.     Please refer to Patient Instructions for Blood Glucose Monitoring and Insulin/Medications Dosing Guide"  in media tab for additional information. Please  also refer to " Patient Self Inventory" in the Media  tab for reviewed elements of pertinent patient history.  Taylor Wilkins participated in the discussions, expressed understanding,  and voiced agreement with the above plans.  All questions were answered to her satisfaction. she is encouraged to contact clinic should she have any questions or concerns prior to her return visit.    Follow up plan: -Return in about 6 months (around 05/31/2022) for Bring Meter/CGM Device/Logs- A1c in Office.  Glade Lloyd, MD Phone: [phone]  Fax: [phone]  This note was partially dictated with voice recognition software. Similar sounding words can be transcribed inadequately or may not  be corrected upon review.  11/28/2021, 2:34 PM

## 2021-12-14 ENCOUNTER — Telehealth: Payer: Self-pay | Admitting: "Endocrinology

## 2021-12-14 DIAGNOSIS — E1165 Type 2 diabetes mellitus with hyperglycemia: Secondary | ICD-10-CM

## 2021-12-14 NOTE — Telephone Encounter (Signed)
Spoke with pt, advised her we do not have any coupons or samples at this time. Discussed with her about contacting her insurance company to see if they will cover Dexcom G6 or G7. Pt stated she would contact her insurance company today to see about coverage.

## 2021-12-14 NOTE — Telephone Encounter (Signed)
Pt states that she has new insurance and her insurance now no longer covers the [place]. She is asking for advise on this and if maybe we have coupons

## 2021-12-18 MED ORDER — FREESTYLE LIBRE 2 SENSOR MISC: 2 refills | Status: DC

## 2021-12-18 NOTE — Telephone Encounter (Signed)
Patient left a Vm stating she needs a new RX sent in for her [employer] 2 to [facility] on [address]

## 2021-12-18 NOTE — Addendum Note (Signed)
Addended by: Ellin Saba on: 12/18/2021 03:11 PM   Modules accepted: Orders

## 2021-12-18 NOTE — Telephone Encounter (Signed)
Rx sent to requested pharmacy

## 2021-12-21 ENCOUNTER — Telehealth: Payer: Self-pay | Admitting: "Endocrinology

## 2021-12-21 DIAGNOSIS — E1165 Type 2 diabetes mellitus with hyperglycemia: Secondary | ICD-10-CM

## 2021-12-21 MED ORDER — FREESTYLE LIBRE 2 READER DEVI: 0 refills | Status: AC

## 2021-12-21 MED ORDER — FREESTYLE LIBRE 2 SENSOR MISC: 2 refills | Status: AC

## 2021-12-21 NOTE — Telephone Encounter (Signed)
Pt stated she needed Rxs for both Libre 2 sensors and a reader. Rxs for each were sent to [facility] in [place] as requested.

## 2021-12-21 NOTE — Telephone Encounter (Signed)
New message   Need a new prescription per medicare    1. Which medications need to be refilled? (please list name of each medication and dose if known)   Continuous Blood Gluc Receiver (FREESTYLE LIBRE 2 READER) DEVI  Continuous Blood Gluc Sensor (FREESTYLE LIBRE 2 SENSOR) MISC  2. Which pharmacy/location (including street and city if local pharmacy) is medication to be sent to?[place] [phone]  3. Do they need a 30 day or 90 day supply?

## 2021-12-21 NOTE — Telephone Encounter (Signed)
Left message making pt aware we had sent in Rx refill for her sensors. Requested pt call us back to let us know if she also needs Rx for the reader.

## 2021-12-24 ENCOUNTER — Telehealth: Payer: Self-pay

## 2021-12-24 NOTE — Telephone Encounter (Signed)
PA for [place] 2 sensors sent to insurance through Coverm[employer]eds.

## 2021-12-26 ENCOUNTER — Telehealth: Payer: Self-pay | Admitting: "Endocrinology

## 2021-12-26 DIAGNOSIS — E1165 Type 2 diabetes mellitus with hyperglycemia: Secondary | ICD-10-CM

## 2021-12-26 MED ORDER — TOUJEO SOLOSTAR 300 UNIT/ML ~~LOC~~ SOPN: 54.0000 [IU] | PEN_INJECTOR | Freq: Every day | SUBCUTANEOUS | 1 refills | Status: DC

## 2021-12-26 NOTE — Telephone Encounter (Signed)
Pt requesting a refill on her Toujeo. Please send to [facility]

## 2021-12-26 NOTE — Telephone Encounter (Signed)
Rx refill sent.

## 2021-12-27 ENCOUNTER — Other Ambulatory Visit: Payer: Self-pay

## 2021-12-27 DIAGNOSIS — E1165 Type 2 diabetes mellitus with hyperglycemia: Secondary | ICD-10-CM

## 2021-12-27 MED ORDER — TOUJEO SOLOSTAR 300 UNIT/ML ~~LOC~~ SOPN: 54.0000 [IU] | PEN_INJECTOR | Freq: Every day | SUBCUTANEOUS | 1 refills | Status: DC

## 2022-01-25 ENCOUNTER — Telehealth: Payer: Self-pay | Admitting: "Endocrinology

## 2022-01-25 NOTE — Telephone Encounter (Signed)
Left a message requesting a return call to the office. 

## 2022-01-25 NOTE — Telephone Encounter (Signed)
Tammy with [facility] is calling to see if we received a authorization or referral for patient's ABI that was completed here in the office on 04/24/2020. She is asking for a return call at [phone]

## 2022-03-18 ENCOUNTER — Telehealth: Payer: Self-pay | Admitting: "Endocrinology

## 2022-03-18 DIAGNOSIS — Z794 Long term (current) use of insulin: Secondary | ICD-10-CM

## 2022-03-18 MED ORDER — INSULIN LISPRO 200 UNIT/ML ~~LOC~~ SOPN: 14.0000 [IU] | PEN_INJECTOR | Freq: Three times a day (TID) | SUBCUTANEOUS | 0 refills | Status: DC

## 2022-03-18 NOTE — Telephone Encounter (Signed)
Rx sent 

## 2022-03-18 NOTE — Telephone Encounter (Signed)
Patient left a Vm stating she needs a refill on Humalog - please send to [facility] in [place]

## 2022-04-17 ENCOUNTER — Encounter (INDEPENDENT_AMBULATORY_CARE_PROVIDER_SITE_OTHER): Payer: Medicare Other | Admitting: Ophthalmology

## 2022-04-29 ENCOUNTER — Encounter (INDEPENDENT_AMBULATORY_CARE_PROVIDER_SITE_OTHER): Payer: Medicare Other | Admitting: Ophthalmology

## 2022-04-29 DIAGNOSIS — H35033 Hypertensive retinopathy, bilateral: Secondary | ICD-10-CM | POA: Diagnosis not present

## 2022-04-29 DIAGNOSIS — I10 Essential (primary) hypertension: Secondary | ICD-10-CM

## 2022-04-29 DIAGNOSIS — E113393 Type 2 diabetes mellitus with moderate nonproliferative diabetic retinopathy without macular edema, bilateral: Secondary | ICD-10-CM | POA: Diagnosis not present

## 2022-04-29 DIAGNOSIS — H35372 Puckering of macula, left eye: Secondary | ICD-10-CM | POA: Diagnosis not present

## 2022-04-29 DIAGNOSIS — H43813 Vitreous degeneration, bilateral: Secondary | ICD-10-CM

## 2022-06-05 ENCOUNTER — Ambulatory Visit (INDEPENDENT_AMBULATORY_CARE_PROVIDER_SITE_OTHER): Payer: Medicare Other | Admitting: "Endocrinology

## 2022-06-05 ENCOUNTER — Encounter: Payer: Self-pay | Admitting: "Endocrinology

## 2022-06-05 VITALS — BP 138/80 | HR 68 | Ht 62.0 in | Wt 199.6 lb

## 2022-06-05 DIAGNOSIS — E782 Mixed hyperlipidemia: Secondary | ICD-10-CM

## 2022-06-05 DIAGNOSIS — Z794 Long term (current) use of insulin: Secondary | ICD-10-CM | POA: Diagnosis not present

## 2022-06-05 DIAGNOSIS — I10 Essential (primary) hypertension: Secondary | ICD-10-CM

## 2022-06-05 DIAGNOSIS — E1165 Type 2 diabetes mellitus with hyperglycemia: Secondary | ICD-10-CM | POA: Diagnosis not present

## 2022-06-05 LAB — POCT GLYCOSYLATED HEMOGLOBIN (HGB A1C): HbA1c, POC (controlled diabetic range): 7.8 % — AB (ref 0.0–7.0)

## 2022-06-05 MED ORDER — TOUJEO SOLOSTAR 300 UNIT/ML ~~LOC~~ SOPN: 50.0000 [IU] | PEN_INJECTOR | Freq: Every day | SUBCUTANEOUS | 1 refills | Status: DC

## 2022-06-05 MED ORDER — INSULIN LISPRO 200 UNIT/ML ~~LOC~~ SOPN: 16.0000 [IU] | PEN_INJECTOR | Freq: Three times a day (TID) | SUBCUTANEOUS | 0 refills | Status: DC

## 2022-06-05 NOTE — Patient Instructions (Signed)

## 2022-06-05 NOTE — Progress Notes (Signed)
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   Sleep apnea    Past Surgical History:  Procedure Laterality Date   ABDOMINAL HYSTERECTOMY     ANKLE SURGERY  05/13/2004   CATARACT EXTRACTION Bilateral 2023   TONSILLECTOMY  05/14/1955   Social History   Socioeconomic History   Marital status: Married    Spouse name: Not on file   Number of children: Not on file   Years of education: Not on file   Highest education level: Not on file  Occupational History   Not on file  Tobacco Use   Smoking status: Never   Smokeless tobacco: Never  Vaping Use   Vaping Use: Never used  Substance and Sexual Activity   Alcohol use: Never   Drug use: Never   Sexual activity: Not on file  Other Topics Concern   Not on file  Social History Narrative   Not on file   Social Determinants of Health   Financial Resource Strain: Not on file  Food Insecurity: Not on file  Transportation Needs: Not on file  Physical Activity: Not on file  Stress: Not on file  Social Connections: Not on file   Outpatient Encounter Medications as of 06/05/2022  Medication Sig   aspirin 81 MG tablet Take 81 mg by mouth daily.   atorvastatin (LIPITOR) 10 MG tablet Take 1 tablet (10 mg total) by mouth daily.   B Complex Vitamins (VITAMIN B COMPLEX PO) Take 1 tablet by mouth daily.   B-D UF III MINI PEN NEEDLES 31G X 5 MM MISC USE 1 NEEDLE AS DIRECTED   Calcium Citrate-Vitamin D (CALCIUM + D PO) Take by mouth.   cholecalciferol (VITAMIN D) 1000 UNITS tablet Take 1,000 Units by mouth daily.   Continuous Blood Gluc Receiver (FREESTYLE LIBRE 2 READER) DEVI USE AS DIRECTED   Continuous Blood Gluc Sensor  (FREESTYLE LIBRE 2 SENSOR) MISC Use to check glucose as directed. Change sensor every 14 days.   diclofenac Sodium (VOLTAREN) 1 % GEL Apply 1 application topically 4 (four) times daily.   escitalopram (LEXAPRO) 5 MG tablet Take 5 mg by mouth daily.   Fish Oil-Cholecalciferol (FISH OIL + D3) 1000-1000 MG-UNIT CAPS Take by mouth.   furosemide (LASIX) 20 MG tablet Take 20 mg by mouth daily. As needed   hydrALAZINE (APRESOLINE) 25 MG tablet Take 1 tablet by mouth 4 (four) times daily.   Ibrutinib (IMBRUVICA) 280 MG TABS Take 1 tablet by mouth daily in the afternoon.   insulin glargine, 1 Unit Dial, (TOUJEO SOLOSTAR) 300 UNIT/ML Solostar Pen Inject 50 Units into the skin at bedtime.   insulin lispro (HUMALOG) 200 UNIT/ML KwikPen Inject 16-19 Units into the skin 3 (three) times daily before meals.   Insulin Pen Needle (BD PEN NEEDLE NANO U/F) 32G X 4 MM MISC 1 each by Does not apply route 4 (four) times daily.   lisinopril (ZESTRIL) 20 MG tablet Take 40 mg by mouth daily with breakfast.   Magnesium 250 MG TABS every day   pregabalin (LYRICA) 50 MG capsule Lyrica 50 mg capsule  Take  1 capsule 3 times a day by oral route.   Probiotic Product (PROBIOTIC PO) Take 1 tablet by mouth daily in the afternoon.   TURMERIC PO Take 1 tablet by mouth daily.   [DISCONTINUED] insulin glargine, 1 Unit Dial, (TOUJEO SOLOSTAR) 300 UNIT/ML Solostar Pen Inject 54 Units into the skin at bedtime.   [DISCONTINUED] insulin lispro (HUMALOG) 200 UNIT/ML KwikPen Inject 14-17 Units into the skin 3 (three) times daily before meals.   [DISCONTINUED] TOUJEO MAX SOLOSTAR 300 UNIT/ML Solostar Pen INJECT 50 UNITS UNDER THE SKIN AT BEDTIME   No facility-administered encounter medications on file as of 06/05/2022.   ALLERGIES: No Known Allergies VACCINATION STATUS: Immunization History  Administered Date(s) Administered   Moderna Sars-Covid-2 Vaccination 06/09/2019, 07/07/2019    Diabetes She presents for her follow-up diabetic  visit. She has type 2 diabetes mellitus. Onset time: He was diagnosed at approximate age of 80 years. Her disease course has been worsening. There are no hypoglycemic associated symptoms. Pertinent negatives for hypoglycemia include no confusion, headaches, pallor or seizures. Pertinent negatives for diabetes include no chest pain, no polydipsia, no polyphagia and no polyuria. There are no hypoglycemic complications. Symptoms are worsening. Diabetic complications include retinopathy. Risk factors for coronary artery disease include diabetes mellitus, dyslipidemia, hypertension and tobacco exposure. Current diabetic treatment includes insulin injections and oral agent (dual therapy). Her weight is fluctuating minimally. She is following a generally unhealthy diet. She has had a previous visit with a dietitian. She rarely participates in exercise. Her home blood glucose trend is increasing steadily. Her breakfast blood glucose range is generally 140-180 mg/dl. Her lunch blood glucose range is generally 180-200 mg/dl. Her dinner blood glucose range is generally 180-200 mg/dl. Her bedtime blood glucose range is generally 180-200 mg/dl. Her overall blood glucose range is 180-200 mg/dl. (She wears a freestyle libre device.  Her device was downloaded and analyzed.  She has 53% time in range, 22% level 1 hyperglycemia, 23% level 2 hyperglycemia.  She has no significant hypoglycemia.  Her point-of-care A1c is 7.8% increasing from 6.6%.    ) An ACE inhibitor/angiotensin II receptor blocker is being taken. Eye exam is current.  Hyperlipidemia This is a chronic problem. The current episode started more than 1 year ago. The problem is controlled. Exacerbating diseases include diabetes and obesity. Pertinent negatives include no chest pain, myalgias or shortness of breath. Current antihyperlipidemic treatment includes statins. Risk factors for coronary artery disease include diabetes mellitus, obesity, a sedentary lifestyle  and post-menopausal.  Hypertension This is a chronic problem. The current episode started more than 1 year ago. The problem is controlled. Pertinent negatives include no chest pain, headaches, palpitations or shortness of breath. Risk factors for coronary artery disease include diabetes mellitus, dyslipidemia, sedentary lifestyle, post-menopausal state and obesity. Past treatments include calcium channel blockers. Hypertensive end-organ damage includes retinopathy.     Review of systems    Objective:    BP 138/80   Pulse 68   Ht '5\' 2"'$  (1.575 m)   Wt 199 lb 9.6 oz (90.5 kg)   BMI 36.51 kg/m   Wt Readings from Last 3 Encounters:  06/05/22 199 lb 9.6 oz (90.5 kg)  11/28/21 198 lb 3.2 oz (89.9 kg)  10/16/21 199 lb 6.4 oz (90.4 kg)        Results for orders placed or performed in visit on 06/05/22  HgB A1c  Result Value Ref Range   Hemoglobin A1C     HbA1c POC (<> result, manual entry)  HbA1c, POC (prediabetic range)     HbA1c, POC (controlled diabetic range) 7.8 (A) 0.0 - 7.0 %   Lipid Panel     Component Value Date/Time   CHOL 164 11/20/2021 0000   CHOL 163 12/10/2019 0905   TRIG 148 11/20/2021 0000   HDL 49 11/20/2021 0000   HDL 46 12/10/2019 0905   CHOLHDL 3.5 12/10/2019 0905   LDLCALC 89 11/20/2021 0000   LDLCALC 92 12/10/2019 0905   Recent Results (from the past 2160 hour(s))  HgB A1c     Status: Abnormal   Collection Time: 06/05/22  8:49 AM  Result Value Ref Range   Hemoglobin A1C     HbA1c POC (<> result, manual entry)     HbA1c, POC (prediabetic range)     HbA1c, POC (controlled diabetic range) 7.8 (A) 0.0 - 7.0 %    Assessment & Plan:   1. Uncontrolled type 2 diabetes mellitus with complication.   She wears a freestyle libre device.  Her device was downloaded and analyzed.  She has 53% time in range, 22% level 1 hyperglycemia, 23% level 2 hyperglycemia.  She has no significant hypoglycemia.  Her point-of-care A1c is 7.8% increasing from 6.6%.      Patient remains at a high risk for more acute and chronic complications of diabetes which include CAD, CVA, CKD, retinopathy, and neuropathy. These are all discussed in detail with the patient. Glucose logs and insulin administration records pertaining to this visit,  to be scanned into patient's records.  Recent labs reviewed.   - I have re-counseled the patient on diet management  by adopting a carbohydrate restricted / protein rich  Diet.  - she acknowledges that there is a room for improvement in her food and drink choices. - Suggestion is made for her to avoid simple carbohydrates  from her diet including Cakes, Sweet Desserts, Ice Cream, Soda (diet and regular), Sweet Tea, Candies, Chips, Cookies, Store Bought Juices, Alcohol in Excess of  1-2 drinks a day, Artificial Sweeteners,  Coffee Creamer, and "Sugar-free" Products, Lemonade. This will help patient to have more stable blood glucose profile and potentially avoid unintended weight gain.  - Patient is advised to stick to a routine mealtimes to eat 3 meals  a day and avoid unnecessary snacks ( to snack only to correct hypoglycemia).   - I have approached patient with the following individualized plan to manage diabetes and patient agrees.  - She we will continue to need intensive treatment with basal/bolus insulin in order for her to maintain control of diabetes to target.    She needs more coverage for her postprandial hyperglycemia.  -To avoid hypoglycemia is the priority in her care.   -I discussed and lowered her Toujeo to 50 units nightly, discussed and increase her Humalog to 16-19  units 3 times a day before meals for pre-meal glucose above 90 mg/dL,  associated with strict documenting blood glucose at least 4 times a day-before meals and at bedtime.   -She is wearing the Libre CGM device-advised to wear it at all times.    -Patient is encouraged to call clinic for blood glucose levels less than 70 or above 200 mg /dl x 3  readings in a row.  -  She will stay off of metformin for now.   - She is not a good candidate for incretin therapy, SGLT2 inhibitors . Her anti-GAD and anti-islet cell antibodies are negative.  2) BP/HTN:  -Her blood pressure is controlled to target.  She is advised to increase her lisinopril to 20 mg p.o. daily at breakfast.  She also takes verapamil 240 mg p.o. daily.   3) Lipids/HPL: Her recent lipid panel showed LDL at 89, overall improving from 139.  She is advised to continue her atorvastatin 10 mg p.o. nightly.  Side effects and precautions discussed with her.     4)  Weight/Diet:  Her BMI is 36.51--complicating her diabetes care.  She is a candidate for modest weight loss.  CDE consult in progress, exercise, and carbohydrates information provided. -Her thyroid function test is within normal limits.     5) Chronic Care/Health Maintenance:  -Patient is on ACEI/ARB and Statin medications and encouraged to continue to follow up with Ophthalmology, Podiatrist at least yearly or according to recommendations, and advised to  stay away from smoking. I have recommended yearly flu vaccine and pneumonia vaccination at least every 5 years; moderate intensity exercise for up to 150 minutes weekly; and  sleep for at least 7 hours a day.   Her screening ABI was normal in December 2021, her next study will be due in December 2026, or sooner if needed.   - I advised patient to maintain close follow up with her PCP for primary care needs.   I spent 26 minutes in the care of the patient today including review of labs from [facility], Lipids, Thyroid Function, Hematology (current and previous including abstractions from other facilities); face-to-face time discussing  her blood glucose readings/logs, discussing hypoglycemia and hyperglycemia episodes and symptoms, medications doses, her options of short and long term treatment based on the latest standards of care / guidelines;  discussion about  incorporating lifestyle medicine;  and documenting the encounter. Risk reduction counseling performed per USPSTF guidelines to reduce  obesity and cardiovascular risk factors.     Please refer to Patient Instructions for Blood Glucose Monitoring and Insulin/Medications Dosing Guide"  in media tab for additional information. Please  also refer to " Patient Self Inventory" in the Media  tab for reviewed elements of pertinent patient history.  Taylor Wilkins participated in the discussions, expressed understanding, and voiced agreement with the above plans.  All questions were answered to her satisfaction. she is encouraged to contact clinic should she have any questions or concerns prior to her return visit.   Follow up plan: -Return in about 3 months (around 09/04/2022) for F/U with Pre-visit Labs, Meter/CGM/Logs, A1c here.  Glade Lloyd, MD Phone: [phone]  Fax: [phone]  This note was partially dictated with voice recognition software. Similar sounding words can be transcribed inadequately or may not  be corrected upon review.  06/05/2022, 9:01 AM

## 2022-08-05 ENCOUNTER — Other Ambulatory Visit: Payer: Self-pay | Admitting: "Endocrinology

## 2022-08-05 DIAGNOSIS — E1165 Type 2 diabetes mellitus with hyperglycemia: Secondary | ICD-10-CM

## 2022-08-24 ENCOUNTER — Other Ambulatory Visit: Payer: Self-pay | Admitting: "Endocrinology

## 2022-08-24 DIAGNOSIS — E1165 Type 2 diabetes mellitus with hyperglycemia: Secondary | ICD-10-CM

## 2022-08-29 LAB — COMPREHENSIVE METABOLIC PANEL
ALT: 16 IU/L (ref 0–32)
AST: 18 IU/L (ref 0–40)
Albumin/Globulin Ratio: 2.1 (ref 1.2–2.2)
Albumin: 4 g/dL (ref 3.8–4.8)
Alkaline Phosphatase: 89 IU/L (ref 44–121)
BUN/Creatinine Ratio: 21 (ref 12–28)
BUN: 20 mg/dL (ref 8–27)
Bilirubin Total: 0.4 mg/dL (ref 0.0–1.2)
CO2: 22 mmol/L (ref 20–29)
Calcium: 9.4 mg/dL (ref 8.7–10.3)
Chloride: 104 mmol/L (ref 96–106)
Creatinine, Ser: 0.97 mg/dL (ref 0.57–1.00)
Globulin, Total: 1.9 g/dL (ref 1.5–4.5)
Glucose: 168 mg/dL — ABNORMAL HIGH (ref 70–99)
Potassium: 4.8 mmol/L (ref 3.5–5.2)
Sodium: 142 mmol/L (ref 134–144)
Total Protein: 5.9 g/dL — ABNORMAL LOW (ref 6.0–8.5)
eGFR: 61 mL/min/{1.73_m2} (ref >59)

## 2022-08-29 LAB — VITAMIN B12: Vitamin B-12: 672 pg/mL (ref 232–1245)

## 2022-08-29 LAB — LIPID PANEL
Chol/HDL Ratio: 3.4 ratio (ref 0.0–4.4)
Cholesterol, Total: 163 mg/dL (ref 100–199)
HDL: 48 mg/dL (ref >39)
LDL Chol Calc (NIH): 87 mg/dL (ref 0–99)
Triglycerides: 160 mg/dL — ABNORMAL HIGH (ref 0–149)
VLDL Cholesterol Cal: 28 mg/dL (ref 5–40)

## 2022-08-29 LAB — TSH: TSH: 2.45 u[IU]/mL (ref 0.450–4.500)

## 2022-08-29 LAB — T4, FREE: Free T4: 1.46 ng/dL (ref 0.82–1.77)

## 2022-09-04 ENCOUNTER — Encounter: Payer: Self-pay | Admitting: "Endocrinology

## 2022-09-04 ENCOUNTER — Ambulatory Visit (INDEPENDENT_AMBULATORY_CARE_PROVIDER_SITE_OTHER): Payer: Medicare Other | Admitting: "Endocrinology

## 2022-09-04 VITALS — BP 136/78 | HR 68 | Ht 62.0 in | Wt 201.0 lb

## 2022-09-04 DIAGNOSIS — I10 Essential (primary) hypertension: Secondary | ICD-10-CM

## 2022-09-04 DIAGNOSIS — E1165 Type 2 diabetes mellitus with hyperglycemia: Secondary | ICD-10-CM

## 2022-09-04 DIAGNOSIS — E782 Mixed hyperlipidemia: Secondary | ICD-10-CM

## 2022-09-04 DIAGNOSIS — Z794 Long term (current) use of insulin: Secondary | ICD-10-CM

## 2022-09-04 LAB — POCT GLYCOSYLATED HEMOGLOBIN (HGB A1C): HbA1c, POC (controlled diabetic range): 7.4 % — AB (ref 0.0–7.0)

## 2022-09-04 MED ORDER — TOUJEO SOLOSTAR 300 UNIT/ML ~~LOC~~ SOPN
50.0000 [IU] | PEN_INJECTOR | Freq: Every day | SUBCUTANEOUS | 1 refills | Status: DC
Start: 2022-09-04 — End: 2023-04-07

## 2022-09-04 NOTE — Patient Instructions (Signed)

## 2022-09-04 NOTE — Progress Notes (Signed)
09/04/2022   Endocrinology follow-up note  Subjective:    Patient ID: Taylor Wilkins, female    DOB: [birth year], PCP Stoney Bang, FNP   Past Medical History:  Diagnosis Date   Cancer    Basal cell on face   Chest pain 08/2008   normal coronary angiogram   Diabetes    Hyperlipemia    Hypertension    Leukemia, chronic    CLL   Sleep apnea    Past Surgical History:  Procedure Laterality Date   ABDOMINAL HYSTERECTOMY     ANKLE SURGERY  05/13/2004   CATARACT EXTRACTION Bilateral 2023   TONSILLECTOMY  05/14/1955   Social History   Socioeconomic History   Marital status: Married    Spouse name: Not on file   Number of children: Not on file   Years of education: Not on file   Highest education level: Not on file  Occupational History   Not on file  Tobacco Use   Smoking status: Never   Smokeless tobacco: Never  Vaping Use   Vaping Use: Never used  Substance and Sexual Activity   Alcohol use: Never   Drug use: Never   Sexual activity: Not on file  Other Topics Concern   Not on file  Social History Narrative   Not on file   Social Determinants of Health   Financial Resource Strain: Not on file  Food Insecurity: Not on file  Transportation Needs: Not on file  Physical Activity: Not on file  Stress: Not on file  Social Connections: Not on file   Outpatient Encounter Medications as of 09/04/2022  Medication Sig   aspirin 81 MG tablet Take 81 mg by mouth daily.   atorvastatin (LIPITOR) 10 MG tablet Take 1 tablet (10 mg total) by mouth daily.   B Complex Vitamins (VITAMIN B COMPLEX PO) Take 1 tablet by mouth daily.   B-D UF III MINI PEN NEEDLES 31G X 5 MM MISC USE 1 NEEDLE AS DIRECTED   Calcium Citrate-Vitamin D (CALCIUM + D PO) Take by mouth.   cholecalciferol (VITAMIN D) 1000 UNITS tablet Take 1,000 Units by mouth daily.   Continuous Blood Gluc Receiver (FREESTYLE LIBRE 2 READER) DEVI USE AS DIRECTED   Continuous Blood Gluc Sensor (FREESTYLE LIBRE 2  SENSOR) MISC Use to check glucose as directed. Change sensor every 14 days.   diclofenac Sodium (VOLTAREN) 1 % GEL Apply 1 application topically 4 (four) times daily.   escitalopram (LEXAPRO) 5 MG tablet Take 5 mg by mouth daily.   Fish Oil-Cholecalciferol (FISH OIL + D3) 1000-1000 MG-UNIT CAPS Take by mouth.   furosemide (LASIX) 20 MG tablet Take 20 mg by mouth daily. As needed   hydrALAZINE (APRESOLINE) 25 MG tablet Take 1 tablet by mouth 4 (four) times daily.   Ibrutinib (IMBRUVICA) 280 MG TABS Take 1 tablet by mouth daily in the afternoon.   insulin glargine, 1 Unit Dial, (TOUJEO SOLOSTAR) 300 UNIT/ML Solostar Pen Inject 50 Units into the skin at bedtime.   insulin lispro (HUMALOG KWIKPEN) 200 UNIT/ML KwikPen Inject 16-19 Units into the skin 3 (three) times daily after meals.   Insulin Pen Needle (BD PEN NEEDLE NANO U/F) 32G X 4 MM MISC 1 each by Does not apply route 4 (four) times daily.   lisinopril (ZESTRIL) 20 MG tablet Take 40 mg by mouth daily with breakfast.   Magnesium 250 MG TABS every day   pregabalin (LYRICA) 50 MG capsule Lyrica 50 mg capsule  Take 1 capsule  3 times a day by oral route.   Probiotic Product (PROBIOTIC PO) Take 1 tablet by mouth daily in the afternoon.   TURMERIC PO Take 1 tablet by mouth daily.   [DISCONTINUED] TOUJEO SOLOSTAR 300 UNIT/ML Solostar Pen INJECT 54 UNITS UNDER THE SKIN EVERY NIGHT AT BEDTIME (Patient taking differently: Inject 50 Units into the skin at bedtime.)   No facility-administered encounter medications on file as of 09/04/2022.   ALLERGIES: No Known Allergies VACCINATION STATUS: Immunization History  Administered Date(s) Administered   Moderna Sars-Covid-2 Vaccination 06/09/2019, 07/07/2019    Diabetes She presents for her follow-up diabetic visit. She has type 2 diabetes mellitus. Onset time: He was diagnosed at approximate age of 44 years. Her disease course has been improving. There are no hypoglycemic associated symptoms. Pertinent  negatives for hypoglycemia include no confusion, headaches, pallor or seizures. Pertinent negatives for diabetes include no chest pain, no polydipsia, no polyphagia and no polyuria. There are no hypoglycemic complications. Symptoms are improving. Diabetic complications include retinopathy. Risk factors for coronary artery disease include diabetes mellitus, dyslipidemia, hypertension and tobacco exposure. Current diabetic treatment includes insulin injections and oral agent (dual therapy). Her weight is fluctuating minimally. She has had a previous visit with a dietitian. She rarely participates in exercise. Her home blood glucose trend is decreasing steadily. Her overall blood glucose range is 140-180 mg/dl. (She wears a freestyle libre device, however she did not bring her device.  She does not share her data with [place] state.  She reports glycemic profile mainly within target range, no major hypoglycemia.  Her point-of-care A1c is 7.4%, progressively improving.   ) An ACE inhibitor/angiotensin II receptor blocker is being taken. Eye exam is current.  Hyperlipidemia This is a chronic problem. The current episode started more than 1 year ago. The problem is controlled. Exacerbating diseases include diabetes and obesity. Pertinent negatives include no chest pain, myalgias or shortness of breath. Current antihyperlipidemic treatment includes statins. Risk factors for coronary artery disease include diabetes mellitus, obesity, a sedentary lifestyle and post-menopausal.  Hypertension This is a chronic problem. The current episode started more than 1 year ago. The problem is controlled. Pertinent negatives include no chest pain, headaches, palpitations or shortness of breath. Risk factors for coronary artery disease include diabetes mellitus, dyslipidemia, sedentary lifestyle, post-menopausal state and obesity. Past treatments include calcium channel blockers. Hypertensive end-organ damage includes retinopathy.      Review of systems    Objective:    BP 136/78   Pulse 68   Ht  (1.575 m)   Wt 201 lb (91.2 kg)   BMI 36.76 kg/m   Wt Readings from Last 3 Encounters:  09/04/22 201 lb (91.2 kg)  06/05/22 199 lb 9.6 oz (90.5 kg)  11/28/21 198 lb 3.2 oz (89.9 kg)        Results for orders placed or performed in visit on 09/04/22  HgB A1c  Result Value Ref Range   Hemoglobin A1C     HbA1c POC (<> result, manual entry)     HbA1c, POC (prediabetic range)     HbA1c, POC (controlled diabetic range) 7.4 (A) 0.0 - 7.0 %   Lipid Panel     Component Value Date/Time   CHOL 163 08/28/2022 1130   TRIG 160 (H) 08/28/2022 1130   HDL 48 08/28/2022 1130   CHOLHDL 3.4 08/28/2022 1130   LDLCALC 87 08/28/2022 1130   Recent Results (from the past 2160 hour(s))  Comprehensive metabolic panel     Status:  Abnormal   Collection Time: 08/28/22 11:30 AM  Result Value Ref Range   Glucose 168 (H) 70 - 99 mg/dL   BUN 20 8 - 27 mg/dL   Creatinine, Ser 1.61 0.57 - 1.00 mg/dL   eGFR 61 >09 UE/AVW/0.98   BUN/Creatinine Ratio 21 12 - 28   Sodium 142 134 - 144 mmol/L   Potassium 4.8 3.5 - 5.2 mmol/L   Chloride 104 96 - 106 mmol/L   CO2 22 20 - 29 mmol/L   Calcium 9.4 8.7 - 10.3 mg/dL   Total Protein 5.9 (L) 6.0 - 8.5 g/dL   Albumin 4.0 3.8 - 4.8 g/dL   Globulin, Total 1.9 1.5 - 4.5 g/dL   Albumin/Globulin Ratio 2.1 1.2 - 2.2   Bilirubin Total 0.4 0.0 - 1.2 mg/dL   Alkaline Phosphatase 89 44 - 121 IU/L   AST 18 0 - 40 IU/L   ALT 16 0 - 32 IU/L  Lipid panel     Status: Abnormal   Collection Time: 08/28/22 11:30 AM  Result Value Ref Range   Cholesterol, Total 163 100 - 199 mg/dL   Triglycerides 119 (H) 0 - 149 mg/dL   HDL 48 >14 mg/dL   VLDL Cholesterol Cal 28 5 - 40 mg/dL   LDL Chol Calc (NIH) 87 0 - 99 mg/dL   Chol/HDL Ratio 3.4 0.0 - 4.4 ratio    Comment:                                   T. Chol/HDL Ratio                                             Men  Women                                1/2 Avg.Risk  3.4    3.3                                   Avg.Risk  5.0    4.4                                2X Avg.Risk  9.6    7.1                                3X Avg.Risk 23.4   11.0   TSH     Status: None   Collection Time: 08/28/22 11:30 AM  Result Value Ref Range   TSH 2.450 0.450 - 4.500 uIU/mL  T4, free     Status: None   Collection Time: 08/28/22 11:30 AM  Result Value Ref Range   Free T4 1.46 0.82 - 1.77 ng/dL  Vitamin N82     Status: None   Collection Time: 08/28/22 11:30 AM  Result Value Ref Range   Vitamin B-12 672 232 - 1,245 pg/mL  HgB A1c     Status: Abnormal   Collection Time: 09/04/22 10:25 AM  Result Value Ref Range   Hemoglobin A1C     HbA1c  POC (<> result, manual entry)     HbA1c, POC (prediabetic range)     HbA1c, POC (controlled diabetic range) 7.4 (A) 0.0 - 7.0 %    Assessment & Plan:   1. Uncontrolled type 2 diabetes mellitus with complication.   She wears a freestyle libre device, however she did not bring her device.  She does not share her data with [place] state.  She reports glycemic profile mainly within target range, no major hypoglycemia.  Her point-of-care A1c is 7.4%, progressively improving.    Patient remains at a high risk for more acute and chronic complications of diabetes which include CAD, CVA, CKD, retinopathy, and neuropathy. These are all discussed in detail with the patient. Glucose logs and insulin administration records pertaining to this visit,  to be scanned into patient's records.  Recent labs reviewed.   - I have re-counseled the patient on diet management  by adopting a carbohydrate restricted / protein rich  Diet.   - she acknowledges that there is a room for improvement in her food and drink choices. - Suggestion is made for her to avoid simple carbohydrates  from her diet including Cakes, Sweet Desserts, Ice Cream, Soda (diet and regular), Sweet Tea, Candies, Chips, Cookies, Store Bought Juices, Alcohol in Excess of   1-2 drinks a day, Artificial Sweeteners,  Coffee Creamer, and "Sugar-free" Products, Lemonade. This will help patient to have more stable blood glucose profile and potentially avoid unintended weight gain. - Patient is advised to stick to a routine mealtimes to eat 3 meals  a day and avoid unnecessary snacks ( to snack only to correct hypoglycemia).   - I have approached patient with the following individualized plan to manage diabetes and patient agrees.  - She we will continue to need intensive treatment with basal/bolus insulin in order for her to maintain control of diabetes to target.   -Avoiding hypoglycemia as a priority in this patient.  -I advised her to keep her Toujeo at 50 units nightly, advised to continue Humalog    16-19  units 3 times a day before meals for pre-meal glucose above 90 mg/dL,  associated with strict documenting blood glucose at least 4 times a day-before meals and at bedtime.   -She is wearing the Libre CGM device-advised to wear it at all times.    -Patient is encouraged to call clinic for blood glucose levels less than 70 or above 200 mg /dl x 3 readings in a row.  -  She will stay off of metformin for now.   - She is not a good candidate for incretin therapy, SGLT2 inhibitors . Her anti-GAD and anti-islet cell antibodies are negative.  2) BP/HTN:  -Her blood pressure is controlled to target.  She is advised to increase her lisinopril to 20 mg p.o. daily at breakfast.  She also takes verapamil 240 mg p.o. daily.   3) Lipids/HPL: Her recent lipid panel showed LDL at 87, overall improving from 139.  She is advised to continue atorvastatin 10 mg p.o. nightly.  Side effects and precautions discussed with her.      4)  Weight/Diet:  Her BMI is 36.76-complicating her diabetes care.  She is a candidate for modest weight loss.  CDE consult in progress, exercise, and carbohydrates information provided. -Her thyroid function test is within normal limits.     5)  Chronic Care/Health Maintenance:  -Patient is on ACEI/ARB and Statin medications and encouraged to continue to follow up with Ophthalmology, Podiatrist at  least yearly or according to recommendations, and advised to  stay away from smoking. I have recommended yearly flu vaccine and pneumonia vaccination at least every 5 years; moderate intensity exercise for up to 150 minutes weekly; and  sleep for at least 7 hours a day.  Her diabetes foot exam today significant for mild neuropathy. Her screening ABI was normal in December 2021, her next study will be due in December 2026, or sooner if needed.   - I advised patient to maintain close follow up with her PCP for primary care needs.  I spent  26  minutes in the care of the patient today including review of labs from CMP, Lipids, Thyroid Function, Hematology (current and previous including abstractions from other facilities); face-to-face time discussing  her blood glucose readings/logs, discussing hypoglycemia and hyperglycemia episodes and symptoms, medications doses, her options of short and long term treatment based on the latest standards of care / guidelines;  discussion about incorporating lifestyle medicine;  and documenting the encounter. Risk reduction counseling performed per USPSTF guidelines to reduce  obesity and cardiovascular risk factors.     Please refer to Patient Instructions for Blood Glucose Monitoring and Insulin/Medications Dosing Guide"  in media tab for additional information. Please  also refer to " Patient Self Inventory" in the Media  tab for reviewed elements of pertinent patient history.  Taylor Wilkins participated in the discussions, expressed understanding, and voiced agreement with the above plans.  All questions were answered to her satisfaction. she is encouraged to contact clinic should she have any questions or concerns prior to her return visit.   Follow up plan: -Return in about 3 months (around 12/04/2022) for  Bring Meter/CGM Device/Logs- A1c in Office, Urine MA - [place].  Marquis Lunch, MD Phone: [phone]  Fax: [phone]  This note was partially dictated with voice recognition software. Similar sounding words can be transcribed inadequately or may not  be corrected upon review.  09/04/2022, 11:13 AM

## 2022-10-18 ENCOUNTER — Ambulatory Visit: Payer: Medicare Other | Admitting: Nurse Practitioner

## 2022-10-28 ENCOUNTER — Encounter (INDEPENDENT_AMBULATORY_CARE_PROVIDER_SITE_OTHER): Payer: Medicare Other | Admitting: Ophthalmology

## 2022-10-28 DIAGNOSIS — I10 Essential (primary) hypertension: Secondary | ICD-10-CM | POA: Diagnosis not present

## 2022-10-28 DIAGNOSIS — Z794 Long term (current) use of insulin: Secondary | ICD-10-CM

## 2022-10-28 DIAGNOSIS — H35371 Puckering of macula, right eye: Secondary | ICD-10-CM

## 2022-10-28 DIAGNOSIS — H43813 Vitreous degeneration, bilateral: Secondary | ICD-10-CM

## 2022-10-28 DIAGNOSIS — E113393 Type 2 diabetes mellitus with moderate nonproliferative diabetic retinopathy without macular edema, bilateral: Secondary | ICD-10-CM

## 2022-10-28 DIAGNOSIS — H35033 Hypertensive retinopathy, bilateral: Secondary | ICD-10-CM | POA: Diagnosis not present

## 2022-11-20 ENCOUNTER — Ambulatory Visit: Payer: Medicare Other | Attending: Nurse Practitioner | Admitting: Nurse Practitioner

## 2022-11-20 ENCOUNTER — Encounter: Payer: Self-pay | Admitting: Nurse Practitioner

## 2022-11-20 VITALS — BP 128/58 | HR 67 | Ht 62.0 in | Wt 198.4 lb

## 2022-11-20 DIAGNOSIS — I10 Essential (primary) hypertension: Secondary | ICD-10-CM | POA: Insufficient documentation

## 2022-11-20 DIAGNOSIS — Z794 Long term (current) use of insulin: Secondary | ICD-10-CM | POA: Diagnosis present

## 2022-11-20 DIAGNOSIS — E785 Hyperlipidemia, unspecified: Secondary | ICD-10-CM | POA: Diagnosis not present

## 2022-11-20 DIAGNOSIS — E1165 Type 2 diabetes mellitus with hyperglycemia: Secondary | ICD-10-CM | POA: Insufficient documentation

## 2022-11-20 DIAGNOSIS — R600 Localized edema: Secondary | ICD-10-CM | POA: Insufficient documentation

## 2022-11-20 NOTE — Progress Notes (Signed)
Office Visit    Patient Name: Taylor Wilkins Date of Encounter: 11/20/2022  Primary Care Provider:  Stoney Bang, FNP Primary Cardiologist:  Nanetta Batty, MD  Chief Complaint    77 year old female with a history of hypertension, hyperlipidemia, CLL and type 2 diabetes who presents for follow-up related to hypertension.  Past Medical History    Past Medical History:  Diagnosis Date   Cancer (HCC)    Basal cell on face   Chest pain 08/2008   normal coronary angiogram   Diabetes (HCC)    Hyperlipemia    Hypertension    Leukemia, chronic (HCC)    CLL   Sleep apnea    Past Surgical History:  Procedure Laterality Date   ABDOMINAL HYSTERECTOMY     ANKLE SURGERY  05/13/2004   CATARACT EXTRACTION Bilateral 2023   TONSILLECTOMY  05/14/1955    Allergies  No Known Allergies   Labs/Other Studies Reviewed    The following studies were reviewed today:  Cardiac Studies & Procedures     STRESS TESTS  MYOCARDIAL PERFUSION IMAGING 11/08/2016  Narrative  The left ventricular ejection fraction is normal (55-65%).  Nuclear stress EF: 58%.  Blood pressure demonstrated a normal response to exercise.  There was no ST segment deviation noted during stress.  The study is normal.  This is a low risk study.  Normal exercise nuclear stress test with no evidence of prior infarct or ischemia. Normal BP response to stress. Normal exercise capacity (7.5 METS).   ECHOCARDIOGRAM  ECHOCARDIOGRAM COMPLETE 11/08/2016  Narrative *[facility]* [address] [place], [place] [address] [phone]  ------------------------------------------------------------------- Transthoracic Echocardiography  Patient:    Katheren, Jimmerson MR #:       [id number] Study Date: 11/08/2016 Gender:     F Age:        70 Height:     162.6 cm Weight:     66.7 kg BSA:        1.75 m^2 Pt. Status: Room:  ORDERING     Nanetta Batty, MD REFERRING    Nanetta Batty, MD ATTENDING     Olga Millers PERFORMING   [facility], Outpatient SONOGRAPHER  [facility], RDCS  cc:  ------------------------------------------------------------------- LV EF: 55% -   60%  ------------------------------------------------------------------- Indications:      Chest Pain (R07.9).  ------------------------------------------------------------------- History:   PMH:  Leukemia (Chronic, CLL), Obstructive Sleep Apnea. Dyspnea.  Risk factors:  Family history of coronary artery disease. Hypertension. Diabetes mellitus. Dyslipidemia.  ------------------------------------------------------------------- Study Conclusions  - Left ventricle: The cavity size was normal. Wall thickness was normal. Systolic function was normal. The estimated ejection fraction was in the range of 55% to 60%. Wall motion was normal; there were no regional wall motion abnormalities. Left ventricular diastolic function parameters were normal. - Mitral valve: There was mild regurgitation. - Left atrium: The atrium was mildly dilated.  Impressions:  - Normal LV systolic and diastolic function; mild MR; mild LAE.  ------------------------------------------------------------------- Study data:  Comparison was made to the study of 08/17/2008.  Study status:  Routine.  Procedure:  Transthoracic echocardiography. Image quality was adequate.          Transthoracic echocardiography.  M-mode, complete 2D, spectral Doppler, and color Doppler.  Birthdate:  Patient birthdate: 1946/04/06.  Age:  Patient is 77 yr old.  Sex:  Gender: female.    BMI: 25.2 kg/m^2.  Blood pressure:     133/70  Patient status:  Outpatient.  Study date: Study date: 11/08/2016. Study time:  01:09 PM.  Location:  [facility]  -------------------------------------------------------------------  ------------------------------------------------------------------- Left ventricle:  The cavity size was normal. Wall thickness was normal.  Systolic function was normal. The estimated ejection fraction was in the range of 55% to 60%. Wall motion was normal; there were no regional wall motion abnormalities. Left ventricular diastolic function parameters were normal.  ------------------------------------------------------------------- Aortic valve:   Trileaflet; mildly thickened leaflets. Mobility was not restricted.  Doppler:  Transvalvular velocity was within the normal range. There was no stenosis. There was no regurgitation.  ------------------------------------------------------------------- Aorta:  Aortic root: The aortic root was normal in size.  ------------------------------------------------------------------- Mitral valve:   Structurally normal valve.   Mobility was not restricted.  Doppler:  Transvalvular velocity was within the normal range. There was no evidence for stenosis. There was mild regurgitation.    Peak gradient (D): 6 mm Hg.  ------------------------------------------------------------------- Left atrium:  The atrium was mildly dilated.  ------------------------------------------------------------------- Right ventricle:  The cavity size was normal. Systolic function was normal.  ------------------------------------------------------------------- Pulmonic valve:    Doppler:  Transvalvular velocity was within the normal range. There was no evidence for stenosis.  ------------------------------------------------------------------- Tricuspid valve:   Structurally normal valve.    Doppler: Transvalvular velocity was within the normal range. There was no regurgitation.  ------------------------------------------------------------------- Right atrium:  The atrium was normal in size.  ------------------------------------------------------------------- Pericardium:  There was no pericardial effusion.  ------------------------------------------------------------------- Systemic veins: Inferior  vena cava: The vessel was normal in size.  ------------------------------------------------------------------- Measurements  Left ventricle                         Value        Reference LV ID, ED, PLAX chordal        (L)     42.6  mm     43 - 52 LV ID, ES, PLAX chordal                25.7  mm     23 - 38 LV fx shortening, PLAX chordal         40    %      >=29 LV PW thickness, ED                    8.29  mm     ---------- IVS/LV PW ratio, ED                    0.72         <=1.3 Stroke volume, 2D                      77    ml     ---------- Stroke volume/bsa, 2D                  44    ml/m^2 ---------- LV e&', lateral                         12.3  cm/s   ---------- LV E/e&', lateral                       10.08        ---------- LV e&', medial                          11.2  cm/s   ---------- LV E/e&',  medial                        11.07        ---------- LV e&', average                         11.75 cm/s   ---------- LV E/e&', average                       10.55        ----------  Ventricular septum                     Value        Reference IVS thickness, ED                      6.01  mm     ----------  LVOT                                   Value        Reference LVOT ID, S                             21    mm     ---------- LVOT area                              3.46  cm^2   ---------- LVOT peak velocity, S                  92.5  cm/s   ---------- LVOT mean velocity, S                  55.1  cm/s   ---------- LVOT VTI, S                            22.2  cm     ----------  Aorta                                  Value        Reference Aortic root ID, ED                     37    mm     ---------- Ascending aorta ID, A-P, S             33    mm     ----------  Left atrium                            Value        Reference LA ID, A-P, ES                         34    mm     ---------- LA ID/bsa, A-P                         1.95  cm/m^2 <=2.2 LA volume, S  69.3  ml     ---------- LA volume/bsa, S                       39.7  ml/m^2 ---------- LA volume, ES, 1-p A4C                 60.7  ml     ---------- LA volume/bsa, ES, 1-p A4C             34.7  ml/m^2 ---------- LA volume, ES, 1-p A2C                 77.1  ml     ---------- LA volume/bsa, ES, 1-p A2C             44.1  ml/m^2 ----------  Mitral valve                           Value        Reference Mitral E-wave peak velocity            124   cm/s   ---------- Mitral A-wave peak velocity            99    cm/s   ---------- Mitral deceleration time               209   ms     150 - 230 Mitral peak gradient, D                6     mm Hg  ---------- Mitral E/A ratio, peak                 1.3          ----------  Right atrium                           Value        Reference RA ID, S-I, ES, A4C            (H)     51.9  mm     34 - 49 RA area, ES, A4C                       18    cm^2   8.3 - 19.5 RA volume, ES, A/L                     50.1  ml     ---------- RA volume/bsa, ES, A/L                 28.7  ml/m^2 ----------  Right ventricle                        Value        Reference TAPSE                                  25.9  mm     ---------- RV s&', lateral, S                      17.4  cm/s   ----------  Legend: (L)  and  (H)  mark values outside specified reference range.  ------------------------------------------------------------------- Prepared and Electronically Authenticated by  Olga Millers  2018-06-29T14:18:33            Recent Labs: 08/28/2022: ALT 16; BUN 20; Creatinine, Ser 0.97; Potassium 4.8; Sodium 142; TSH 2.450  Recent Lipid Panel    Component Value Date/Time   CHOL 163 08/28/2022 1130   TRIG 160 (H) 08/28/2022 1130   HDL 48 08/28/2022 1130   CHOLHDL 3.4 08/28/2022 1130   LDLCALC 87 08/28/2022 1130    History of Present Illness    77 year old female with the above past medical history including hypertension, hyperlipidemia, CLL, and type 2 diabetes.  She  has a family history of CAD.  Prior cardiac catheterization following false positive GXT was normal.  Echocardiogram and Myoview in 2018 were normal.  She was last seen in the office on 10/16/2021 and was doing well from a cardiac standpoint.  She denies any symptoms concerning for angina.  BP was mildly elevated.  She presents today for follow-up accompanied by her husband.  Since her last visit she has been stable from a cardiac standpoint.  She has stable mild nonpitting bilateral lower extremity edema, unchanged from prior visits.  BP mildly elevated upon arrival, improved with recheck. She denies any chest pain, dyspnea, PND, orthopnea, weight gain.  Overall, she reports feeling well.    Home Medications    Current Outpatient Medications  Medication Sig Dispense Refill   aspirin 81 MG tablet Take 81 mg by mouth daily.     atorvastatin (LIPITOR) 10 MG tablet Take 1 tablet (10 mg total) by mouth daily. 90 tablet 3   B Complex Vitamins (VITAMIN B COMPLEX PO) Take 1 tablet by mouth daily.     B-D UF III MINI PEN NEEDLES 31G X 5 MM MISC USE 1 NEEDLE AS DIRECTED 270 each 2   Calcium Citrate-Vitamin D (CALCIUM + D PO) Take by mouth.     cholecalciferol (VITAMIN D) 1000 UNITS tablet Take 1,000 Units by mouth daily.     Continuous Blood Gluc Receiver (FREESTYLE LIBRE 2 READER) DEVI USE AS DIRECTED 1 each 0   Continuous Blood Gluc Sensor (FREESTYLE LIBRE 2 SENSOR) MISC Use to check glucose as directed. Change sensor every 14 days. 2 each 2   diclofenac Sodium (VOLTAREN) 1 % GEL Apply 1 application topically 4 (four) times daily.     escitalopram (LEXAPRO) 5 MG tablet Take 5 mg by mouth daily.     Fish Oil-Cholecalciferol (FISH OIL + D3) 1000-1000 MG-UNIT CAPS Take by mouth.     furosemide (LASIX) 20 MG tablet Take 20 mg by mouth daily. As needed     hydrALAZINE (APRESOLINE) 50 MG tablet Take 1 tablet by mouth 3 (three) times daily.     Ibrutinib (IMBRUVICA) 280 MG TABS Take 1 tablet by mouth daily in  the afternoon.     insulin glargine, 1 Unit Dial, (TOUJEO SOLOSTAR) 300 UNIT/ML Solostar Pen Inject 50 Units into the skin at bedtime. 9 mL 1   insulin lispro (HUMALOG KWIKPEN) 200 UNIT/ML KwikPen Inject 16-19 Units into the skin 3 (three) times daily after meals. 15 mL 1   Insulin Pen Needle (BD PEN NEEDLE NANO U/F) 32G X 4 MM MISC 1 each by Does not apply route 4 (four) times daily. 100 each 3   lisinopril (ZESTRIL) 20 MG tablet Take 40 mg by mouth daily with breakfast.     Magnesium 250 MG TABS every day     pregabalin (LYRICA) 50 MG capsule Lyrica 50 mg capsule  Take 1 capsule 3 times a day  by oral route.     Probiotic Product (PROBIOTIC PO) Take 1 tablet by mouth daily in the afternoon.     TURMERIC PO Take 1 tablet by mouth daily.     No current facility-administered medications for this visit.     Review of Systems    She denies chest pain, palpitations, dyspnea, pnd, orthopnea, n, v, dizziness, syncope, weight gain, or early satiety. All other systems reviewed and are otherwise negative except as noted above.   Physical Exam    VS:  BP (!) 128/58   Pulse 67   Ht 5\' 2"  (1.575 m)   Wt 198 lb 6.4 oz (90 kg)   SpO2 96%   BMI 36.29 kg/m   GEN: Well nourished, well developed, in no acute distress. HEENT: normal. Neck: Supple, no JVD, carotid bruits, or masses. Cardiac: RRR, no murmurs, rubs, or gallops. No clubbing, cyanosis, mild nonpitting bilateral lower extremity edema.  Radials/DP/PT 2+ and equal bilaterally.  Respiratory:  Respirations regular and unlabored, clear to auscultation bilaterally. GI: Soft, nontender, nondistended, BS + x 4. MS: no deformity or atrophy. Skin: warm and dry, no rash. Neuro:  Strength and sensation are intact. Psych: Normal affect.  Accessory Clinical Findings    ECG personally reviewed by me today - EKG Interpretation Date/Time:  Wednesday November 20 2022 15:17:23 EDT Ventricular Rate:  67 PR Interval:  168 QRS Duration:  92 QT  Interval:  430 QTC Calculation: 454 R Axis:   -7  Text Interpretation: Normal sinus rhythm Minimal voltage criteria for LVH, may be normal variant ( Cornell product ) No previous ECGs available Confirmed by Bernadene Person ([id number]) on 11/20/2022 4:06:10 PM  - no acute changes.   No results found for: "WBC", "HGB", "HCT", "MCV", "PLT" Lab Results  Component Value Date   CREATININE 0.97 08/28/2022   BUN 20 08/28/2022   NA 142 08/28/2022   K 4.8 08/28/2022   CL 104 08/28/2022   CO2 22 08/28/2022   Lab Results  Component Value Date   ALT 16 08/28/2022   AST 18 08/28/2022   ALKPHOS 89 08/28/2022   BILITOT 0.4 08/28/2022   Lab Results  Component Value Date   CHOL 163 08/28/2022   HDL 48 08/28/2022   LDLCALC 87 08/28/2022   TRIG 160 (H) 08/28/2022   CHOLHDL 3.4 08/28/2022    Lab Results  Component Value Date   HGBA1C 7.4 (A) 09/04/2022    Assessment & Plan   1. Hypertension: BP elevated upon arrival in office today, improved with recheck, generally well controlled.  She notes she has had difficulty adhering to her hydralazine 3 times daily.  Encouraged adherence. Continue to monitor BP and report BP consistently greater than 140/80.  For now, continue current antihypertensive regimen.   2. Bilateral lower extremity edema:  She has mild nonpitting bilateral lower extremity edema, which she states is unchanged from prior visits.  Otherwise generally euvolemic and well compensated on exam.  I advised her to notify us of any worsening edema.  Encouraged elevation.  3. Hyperlipidemia: LDL was 87 in 08/2022.  Continue Lipitor.  4. Type 2 diabetes: A1c was 7.4 in 08/2022. Monitored and managed per PCP.   5. Disposition: Follow-up in 1 year, sooner if needed.      Joylene Grapes, NP 11/20/2022, 8:22 PM

## 2022-11-20 NOTE — Patient Instructions (Addendum)
Medication Instructions:  Your physician recommends that you continue on your current medications as directed. Please refer to the Current Medication list given to you today.  *If you need a refill on your cardiac medications before your next appointment, please call your pharmacy*   Lab Work: .NONE ordered at this time of appointment    Testing/Procedures: NONE ordered at this time of appointment     Follow-Up: At [facility], you and your health needs are our priority.  As part of our continuing mission to provide you with exceptional heart care, we have created designated Provider Care Teams.  These Care Teams include your primary Cardiologist (physician) and Advanced Practice Providers (APPs -  Physician Assistants and Nurse Practitioners) who all work together to provide you with the care you need, when you need it.  We recommend signing up for the patient portal called "MyChart".  Sign up information is provided on this After Visit Summary.  MyChart is used to connect with patients for Virtual Visits (Telemedicine).  Patients are able to view lab/test results, encounter notes, upcoming appointments, etc.  Non-urgent messages can be sent to your provider as well.   To learn more about what you can do with MyChart, go to [email or web address].    Your next appointment:   1 year(s)  Provider:   Nanetta Batty, MD

## 2022-12-10 ENCOUNTER — Encounter: Payer: Self-pay | Admitting: "Endocrinology

## 2022-12-10 ENCOUNTER — Ambulatory Visit (INDEPENDENT_AMBULATORY_CARE_PROVIDER_SITE_OTHER): Payer: Medicare Other | Admitting: "Endocrinology

## 2022-12-10 VITALS — BP 96/52 | HR 84 | Ht 62.0 in | Wt 199.0 lb

## 2022-12-10 DIAGNOSIS — I10 Essential (primary) hypertension: Secondary | ICD-10-CM

## 2022-12-10 DIAGNOSIS — E782 Mixed hyperlipidemia: Secondary | ICD-10-CM | POA: Diagnosis not present

## 2022-12-10 DIAGNOSIS — E1165 Type 2 diabetes mellitus with hyperglycemia: Secondary | ICD-10-CM | POA: Diagnosis not present

## 2022-12-10 DIAGNOSIS — Z794 Long term (current) use of insulin: Secondary | ICD-10-CM

## 2022-12-10 MED ORDER — EMPAGLIFLOZIN 10 MG PO TABS
10.0000 mg | ORAL_TABLET | Freq: Every day | ORAL | 1 refills | Status: DC
Start: 2022-12-10 — End: 2023-06-11

## 2022-12-10 MED ORDER — HUMALOG KWIKPEN 200 UNIT/ML ~~LOC~~ SOPN
14.0000 [IU] | PEN_INJECTOR | Freq: Three times a day (TID) | SUBCUTANEOUS | 1 refills | Status: DC
Start: 2022-12-10 — End: 2023-02-04

## 2022-12-10 NOTE — Patient Instructions (Signed)
                                     Advice for Weight Management  -For most of us the best way to lose weight is by diet management. Generally speaking, diet management means consuming less calories intentionally which over time brings about progressive weight loss.  This can be achieved more effectively by avoiding ultra processed carbohydrates, processed meats, unhealthy fats.    It is critically important to know your numbers: how much calorie you are consuming and how much calorie you need. More importantly, our carbohydrates sources should be unprocessed naturally occurring  complex starch food items.  It is always important to balance nutrition also by  appropriate intake of proteins (mainly plant-based), healthy fats/oils, plenty of fruits and vegetables.   -The American College of Lifestyle Medicine (ACL M) recommends nutrition derived mostly from Whole Food, Plant Predominant Sources example an apple instead of applesauce or apple pie. Eat Plenty of vegetables, Mushrooms, fruits, Legumes, Whole Grains, Nuts, seeds in lieu of processed meats, processed snacks/pastries red meat, poultry, eggs.  Use only water or unsweetened tea for hydration.  The College also recommends the need to stay away from risky substances including alcohol, smoking; obtaining 7-9 hours of restorative sleep, at least 150 minutes of moderate intensity exercise weekly, importance of healthy social connections, and being mindful of stress and seek help when it is overwhelming.    -Sticking to a routine mealtime to eat 3 meals a day and avoiding unnecessary snacks is shown to have a big role in weight control. Under normal circumstances, the only time we burn stored energy is when we are hungry, so allow  some hunger to take place- hunger means no food between appropriate meal times, only water.  It is not advisable to starve.   -It is better to avoid simple carbohydrates including:  Cakes, Sweet Desserts, Ice Cream, Soda (diet and regular), Sweet Tea, Candies, Chips, Cookies, Store Bought Juices, Alcohol in Excess of  1-2 drinks a day, Lemonade,  Artificial Sweeteners, Doughnuts, Coffee Creamers, "Sugar-free" Products, etc, etc.  This is not a complete list.....    -Consulting with certified diabetes educators is proven to provide you with the most accurate and current information on diet.  Also, you may be  interested in discussing diet options/exchanges , we can schedule a visit with Taylor Wilkins, RDN, CDE for individualized nutrition education.  -Exercise: If you are able: 30 -60 minutes a day ,4 days a week, or 150 minutes of moderate intensity exercise weekly.    The longer the better if tolerated.  Combine stretch, strength, and aerobic activities.  If you were told in the past that you have high risk for cardiovascular diseases, or if you are currently symptomatic, you may seek evaluation by your heart doctor prior to initiating moderate to intense exercise programs.                                  Additional Care Considerations for Diabetes/Prediabetes   -Diabetes  is a chronic disease.  The most important care consideration is regular follow-up with your diabetes care provider with the goal being avoiding or delaying its complications and to take advantage of advances in medications and technology.  If appropriate actions are taken early enough, type 2 diabetes can even be   reversed.  Seek information from the right source.  - Whole Food, Plant Predominant Nutrition is highly recommended: Eat Plenty of vegetables, Mushrooms, fruits, Legumes, Whole Grains, Nuts, seeds in lieu of processed meats, processed snacks/pastries red meat, poultry, eggs as recommended by American College of  Lifestyle Medicine (ACLM).  -Type 2 diabetes is known to coexist with other important comorbidities such as high blood pressure and high cholesterol.  It is critical to control not only the  diabetes but also the high blood pressure and high cholesterol to minimize and delay the risk of complications including coronary artery disease, stroke, amputations, blindness, etc.  The good news is that this diet recommendation for type 2 diabetes is also very helpful for managing high cholesterol and high blood blood pressure.  - Studies showed that people with diabetes will benefit from a class of medications known as ACE inhibitors and statins.  Unless there are specific reasons not to be on these medications, the standard of care is to consider getting one from these groups of medications at an optimal doses.  These medications are generally considered safe and proven to help protect the heart and the kidneys.    - People with diabetes are encouraged to initiate and maintain regular follow-up with eye doctors, foot doctors, dentists , and if necessary heart and kidney doctors.     - It is highly recommended that people with diabetes quit smoking or stay away from smoking, and get yearly  flu vaccine and pneumonia vaccine at least every 5 years.  See above for additional recommendations on exercise, sleep, stress management , and healthy social connections.      

## 2022-12-10 NOTE — Progress Notes (Signed)
12/10/2022   Endocrinology follow-up note  Subjective:    Patient ID: Taylor Wilkins, female    DOB: 1946/05/06, PCP Stoney Bang, FNP   Past Medical History:  Diagnosis Date   Cancer [facility])    Basal cell on face   Chest pain 08/2008   normal coronary angiogram   Diabetes (HCC)    Hyperlipemia    Hypertension    Leukemia, chronic (HCC)    CLL   Sleep apnea    Past Surgical History:  Procedure Laterality Date   ABDOMINAL HYSTERECTOMY     ANKLE SURGERY  05/13/2004   CATARACT EXTRACTION Bilateral 2023   TONSILLECTOMY  05/14/1955   Social History   Socioeconomic History   Marital status: Married    Spouse name: Not on file   Number of children: Not on file   Years of education: Not on file   Highest education level: Not on file  Occupational History   Not on file  Tobacco Use   Smoking status: Never   Smokeless tobacco: Never  Vaping Use   Vaping status: Never Used  Substance and Sexual Activity   Alcohol use: Never   Drug use: Never   Sexual activity: Not on file  Other Topics Concern   Not on file  Social History Narrative   Not on file   Social Determinants of Health   Financial Resource Strain: Not on file  Food Insecurity: Not on file  Transportation Needs: Not on file  Physical Activity: Not on file  Stress: Not on file  Social Connections: Not on file   Outpatient Encounter Medications as of 12/10/2022  Medication Sig   empagliflozin (JARDIANCE) 10 MG TABS tablet Take 1 tablet (10 mg total) by mouth daily before breakfast.   aspirin 81 MG tablet Take 81 mg by mouth daily.   atorvastatin (LIPITOR) 10 MG tablet Take 1 tablet (10 mg total) by mouth daily.   B Complex Vitamins (VITAMIN B COMPLEX PO) Take 1 tablet by mouth daily.   B-D UF III MINI PEN NEEDLES 31G X 5 MM MISC USE 1 NEEDLE AS DIRECTED   Calcium Citrate-Vitamin D (CALCIUM + D PO) Take by mouth.   cholecalciferol (VITAMIN D) 1000 UNITS tablet Take 1,000 Units by mouth daily.    Continuous Blood Gluc Receiver (FREESTYLE LIBRE 2 READER) DEVI USE AS DIRECTED   Continuous Blood Gluc Sensor (FREESTYLE LIBRE 2 SENSOR) MISC Use to check glucose as directed. Change sensor every 14 days.   diclofenac Sodium (VOLTAREN) 1 % GEL Apply 1 application topically 4 (four) times daily.   escitalopram (LEXAPRO) 5 MG tablet Take 5 mg by mouth daily.   Fish Oil-Cholecalciferol (FISH OIL + D3) 1000-1000 MG-UNIT CAPS Take by mouth.   furosemide (LASIX) 20 MG tablet Take 40 mg by mouth daily as needed.   hydrALAZINE (APRESOLINE) 50 MG tablet Take 1 tablet by mouth 3 (three) times daily.   Ibrutinib (IMBRUVICA) 280 MG TABS Take 1 tablet by mouth daily in the afternoon.   insulin glargine, 1 Unit Dial, (TOUJEO SOLOSTAR) 300 UNIT/ML Solostar Pen Inject 50 Units into the skin at bedtime.   insulin lispro (HUMALOG KWIKPEN) 200 UNIT/ML KwikPen Inject 14-20 Units into the skin 3 (three) times daily after meals.   Insulin Pen Needle (BD PEN NEEDLE NANO U/F) 32G X 4 MM MISC 1 each by Does not apply route 4 (four) times daily.   lisinopril (ZESTRIL) 20 MG tablet Take 40 mg by mouth daily with breakfast.  Magnesium 250 MG TABS every day   pregabalin (LYRICA) 50 MG capsule Lyrica 50 mg capsule  Take 1 capsule 3 times a day by oral route.   Probiotic Product (PROBIOTIC PO) Take 1 tablet by mouth daily in the afternoon.   TURMERIC PO Take 1 tablet by mouth daily.   [DISCONTINUED] insulin lispro (HUMALOG KWIKPEN) 200 UNIT/ML KwikPen Inject 16-19 Units into the skin 3 (three) times daily after meals.   No facility-administered encounter medications on file as of 12/10/2022.   ALLERGIES: No Known Allergies VACCINATION STATUS: Immunization History  Administered Date(s) Administered   Moderna Sars-Covid-2 Vaccination 06/09/2019, 07/07/2019    Diabetes She presents for her follow-up diabetic visit. She has type 2 diabetes mellitus. Onset time: He was diagnosed at approximate age of 37 years. Her disease  course has been worsening. There are no hypoglycemic associated symptoms. Pertinent negatives for hypoglycemia include no confusion, headaches, pallor or seizures. Pertinent negatives for diabetes include no chest pain, no polydipsia, no polyphagia and no polyuria. There are no hypoglycemic complications. Symptoms are worsening. Diabetic complications include retinopathy. Risk factors for coronary artery disease include diabetes mellitus, dyslipidemia, hypertension and tobacco exposure. Current diabetic treatment includes insulin injections and oral agent (dual therapy). Her weight is fluctuating minimally. She has had a previous visit with a dietitian. She rarely participates in exercise. Her home blood glucose trend is increasing steadily. Her overall blood glucose range is 140-180 mg/dl. (She wears a freestyle libre device, AGP report shows 53% time in range, 29% level 1 hyperglycemia, 18% able to hyperglycemia.  She has no hypoglycemia.  Her point-of-care A1c is 7.8% increasing from 7.4%.   ) An ACE inhibitor/angiotensin II receptor blocker is being taken. Eye exam is current.  Hyperlipidemia This is a chronic problem. The current episode started more than 1 year ago. The problem is controlled. Exacerbating diseases include diabetes and obesity. Pertinent negatives include no chest pain, myalgias or shortness of breath. Current antihyperlipidemic treatment includes statins. Risk factors for coronary artery disease include diabetes mellitus, obesity, a sedentary lifestyle and post-menopausal.  Hypertension This is a chronic problem. The current episode started more than 1 year ago. The problem is controlled. Pertinent negatives include no chest pain, headaches, palpitations or shortness of breath. Risk factors for coronary artery disease include diabetes mellitus, dyslipidemia, sedentary lifestyle, post-menopausal state and obesity. Past treatments include calcium channel blockers. Hypertensive end-organ  damage includes retinopathy.     Review of systems    Objective:    BP (!) 96/52   Pulse 84   Ht 5\' 2"  (1.575 m)   Wt 199 lb (90.3 kg)   BMI 36.40 kg/m   Wt Readings from Last 3 Encounters:  12/10/22 199 lb (90.3 kg)  11/20/22 198 lb 6.4 oz (90 kg)  09/04/22 201 lb (91.2 kg)        Results for orders placed or performed in visit on 09/04/22  HgB A1c  Result Value Ref Range   Hemoglobin A1C     HbA1c POC (<> result, manual entry)     HbA1c, POC (prediabetic range)     HbA1c, POC (controlled diabetic range) 7.4 (A) 0.0 - 7.0 %   Lipid Panel     Component Value Date/Time   CHOL 163 08/28/2022 1130   TRIG 160 (H) 08/28/2022 1130   HDL 48 08/28/2022 1130   CHOLHDL 3.4 08/28/2022 1130   LDLCALC 87 08/28/2022 1130  30 units twice a day No results found for this or any previous  visit (from the past 2160 hour(s)).   Assessment & Plan:   1. Uncontrolled type 2 diabetes mellitus with complication.   She wears a freestyle libre device, AGP report shows 53% time in range, 29% level 1 hyperglycemia, 18% able to hyperglycemia.  She has no hypoglycemia.  Her point-of-care A1c is 7.8% increasing from 7.4%.     Patient remains at a high risk for more acute and chronic complications of diabetes which include CAD, CVA, CKD, retinopathy, and neuropathy. These are all discussed in detail with the patient. Glucose logs and insulin administration records pertaining to this visit,  to be scanned into patient's records.  Recent labs reviewed.   - I have re-counseled the patient on diet management  by adopting a carbohydrate restricted / protein rich  Diet.   - she acknowledges that there is a room for improvement in her food and drink choices. - Suggestion is made for her to avoid simple carbohydrates  from her diet including Cakes, Sweet Desserts, Ice Cream, Soda (diet and regular), Sweet Tea, Candies, Chips, Cookies, Store Bought Juices, Alcohol in Excess of  1-2 drinks a day,  Artificial Sweeteners,  Coffee Creamer, and "Sugar-free" Products, Lemonade. This will help patient to have more stable blood glucose profile and potentially avoid unintended weight gain. - Patient is advised to stick to a routine mealtimes to eat 3 meals  a day and avoid unnecessary snacks ( to snack only to correct hypoglycemia).  - I have approached patient with the following individualized plan to manage diabetes and patient agrees.  - She we will continue to need intensive treatment with basal/bolus insulin in order for her to maintain control of diabetes to target.   -Avoiding hypoglycemia as a priority in this patient.  -She is advised to continue Toujeo 50 units nightly, advised to lower Humalog to 14-20  units 3 times a day before meals for pre-meal glucose above 90 mg/dL,  associated with strict documenting blood glucose at least 4 times a day-before meals and at bedtime.   -She is wearing the Libre CGM device-advised to wear it at all times.    -Patient is encouraged to call clinic for blood glucose levels less than 70 or above 200 mg /dl x 3 readings in a row.  -  She will stay off of metformin for now.   -She may benefit from low-dose SGLT2 inhibitors.  I discussed and added Jardiance 10 mg p.o. daily at breakfast . Her anti-GAD and anti-islet cell antibodies are negative.  2) BP/HTN:  -Her blood pressure is controlled to target.  She is advised to increase her lisinopril to 20 mg p.o. daily at breakfast.  She also takes verapamil 240 mg p.o. daily.   3) Lipids/HPL: Her recent lipid panel showed LDL at 87, overall improving from 139.  She is advised to continue atorvastatin 10 mg p.o. nightly.   Side effects and precautions discussed with her.      4)  Weight/Diet:  Her BMI is 36.40-complicating her diabetes care.  She is a candidate for modest weight loss.  CDE consult in progress, exercise, and carbohydrates information provided. -Her thyroid function test is within normal  limits.     5) Chronic Care/Health Maintenance:  -Patient is on ACEI/ARB and Statin medications and encouraged to continue to follow up with Ophthalmology, Podiatrist at least yearly or according to recommendations, and advised to  stay away from smoking. I have recommended yearly flu vaccine and pneumonia vaccination at least every 5  years; moderate intensity exercise for up to 150 minutes weekly; and  sleep for at least 7 hours a day.  Her diabetes foot exam today significant for mild neuropathy. Her screening ABI was normal in December 2021, her next study will be due in December 2026, or sooner if needed.   - I advised patient to maintain close follow up with her PCP for primary care needs.  I spent  26  minutes in the care of the patient today including review of labs from CMP, Lipids, Thyroid Function, Hematology (current and previous including abstractions from other facilities); face-to-face time discussing  her blood glucose readings/logs, discussing hypoglycemia and hyperglycemia episodes and symptoms, medications doses, her options of short and long term treatment based on the latest standards of care / guidelines;  discussion about incorporating lifestyle medicine;  and documenting the encounter. Risk reduction counseling performed per USPSTF guidelines to reduce  obesity and cardiovascular risk factors.     Please refer to Patient Instructions for Blood Glucose Monitoring and Insulin/Medications Dosing Guide"  in media tab for additional information. Please  also refer to " Patient Self Inventory" in the Media  tab for reviewed elements of pertinent patient history.  Taylor Wilkins participated in the discussions, expressed understanding, and voiced agreement with the above plans.  All questions were answered to her satisfaction. she is encouraged to contact clinic should she have any questions or concerns prior to her return visit.   Follow up plan: -Return in about 6 months  (around 06/12/2023) for F/U with Pre-visit Labs, Meter/CGM/Logs, A1c here.  Marquis Lunch, MD Phone: [phone]  Fax: [phone]  This note was partially dictated with voice recognition software. Similar sounding words can be transcribed inadequately or may not  be corrected upon review.  12/10/2022, 2:47 PM

## 2023-02-04 ENCOUNTER — Other Ambulatory Visit: Payer: Self-pay | Admitting: "Endocrinology

## 2023-02-04 DIAGNOSIS — Z794 Long term (current) use of insulin: Secondary | ICD-10-CM

## 2023-04-07 ENCOUNTER — Other Ambulatory Visit: Payer: Self-pay | Admitting: "Endocrinology

## 2023-04-07 DIAGNOSIS — E1165 Type 2 diabetes mellitus with hyperglycemia: Secondary | ICD-10-CM

## 2023-04-28 ENCOUNTER — Encounter (INDEPENDENT_AMBULATORY_CARE_PROVIDER_SITE_OTHER): Payer: Medicare Other | Admitting: Ophthalmology

## 2023-04-28 DIAGNOSIS — Z794 Long term (current) use of insulin: Secondary | ICD-10-CM

## 2023-04-28 DIAGNOSIS — H35371 Puckering of macula, right eye: Secondary | ICD-10-CM

## 2023-04-28 DIAGNOSIS — H2513 Age-related nuclear cataract, bilateral: Secondary | ICD-10-CM

## 2023-04-28 DIAGNOSIS — H35033 Hypertensive retinopathy, bilateral: Secondary | ICD-10-CM | POA: Diagnosis not present

## 2023-04-28 DIAGNOSIS — H43813 Vitreous degeneration, bilateral: Secondary | ICD-10-CM

## 2023-04-28 DIAGNOSIS — I10 Essential (primary) hypertension: Secondary | ICD-10-CM

## 2023-04-28 DIAGNOSIS — E113393 Type 2 diabetes mellitus with moderate nonproliferative diabetic retinopathy without macular edema, bilateral: Secondary | ICD-10-CM | POA: Diagnosis not present

## 2023-06-05 ENCOUNTER — Other Ambulatory Visit: Payer: Self-pay | Admitting: "Endocrinology

## 2023-06-06 LAB — LIPID PANEL
Chol/HDL Ratio: 3.4 {ratio} (ref 0.0–4.4)
Cholesterol, Total: 171 mg/dL (ref 100–199)
HDL: 51 mg/dL (ref >39)
LDL Chol Calc (NIH): 91 mg/dL (ref 0–99)
Triglycerides: 168 mg/dL — ABNORMAL HIGH (ref 0–149)
VLDL Cholesterol Cal: 29 mg/dL (ref 5–40)

## 2023-06-06 LAB — COMPREHENSIVE METABOLIC PANEL
ALT: 18 [IU]/L (ref 0–32)
AST: 19 [IU]/L (ref 0–40)
Albumin: 3.8 g/dL (ref 3.8–4.8)
Alkaline Phosphatase: 87 [IU]/L (ref 44–121)
BUN/Creatinine Ratio: 20 (ref 12–28)
BUN: 17 mg/dL (ref 8–27)
Bilirubin Total: 0.3 mg/dL (ref 0.0–1.2)
CO2: 24 mmol/L (ref 20–29)
Calcium: 8.9 mg/dL (ref 8.7–10.3)
Chloride: 106 mmol/L (ref 96–106)
Creatinine, Ser: 0.83 mg/dL (ref 0.57–1.00)
Globulin, Total: 1.8 g/dL (ref 1.5–4.5)
Glucose: 158 mg/dL — ABNORMAL HIGH (ref 70–99)
Potassium: 4.5 mmol/L (ref 3.5–5.2)
Sodium: 142 mmol/L (ref 134–144)
Total Protein: 5.6 g/dL — ABNORMAL LOW (ref 6.0–8.5)
eGFR: 73 mL/min/{1.73_m2} (ref >59)

## 2023-06-11 ENCOUNTER — Ambulatory Visit (INDEPENDENT_AMBULATORY_CARE_PROVIDER_SITE_OTHER): Payer: Medicare Other | Admitting: "Endocrinology

## 2023-06-11 ENCOUNTER — Encounter: Payer: Self-pay | Admitting: "Endocrinology

## 2023-06-11 VITALS — BP 112/64 | HR 88 | Ht 62.0 in | Wt 199.4 lb

## 2023-06-11 DIAGNOSIS — I10 Essential (primary) hypertension: Secondary | ICD-10-CM | POA: Diagnosis not present

## 2023-06-11 DIAGNOSIS — E782 Mixed hyperlipidemia: Secondary | ICD-10-CM | POA: Diagnosis not present

## 2023-06-11 DIAGNOSIS — Z794 Long term (current) use of insulin: Secondary | ICD-10-CM | POA: Diagnosis not present

## 2023-06-11 DIAGNOSIS — E1165 Type 2 diabetes mellitus with hyperglycemia: Secondary | ICD-10-CM

## 2023-06-11 LAB — POCT GLYCOSYLATED HEMOGLOBIN (HGB A1C): HbA1c, POC (controlled diabetic range): 8.2 % — AB (ref 0.0–7.0)

## 2023-06-11 MED ORDER — HUMALOG KWIKPEN 200 UNIT/ML ~~LOC~~ SOPN: 14.0000 [IU] | PEN_INJECTOR | Freq: Three times a day (TID) | SUBCUTANEOUS | 0 refills | Status: DC

## 2023-06-11 MED ORDER — EMPAGLIFLOZIN 10 MG PO TABS: 10.0000 mg | ORAL_TABLET | Freq: Every day | ORAL | 1 refills | Status: DC

## 2023-06-11 NOTE — Progress Notes (Signed)
06/11/2023   Endocrinology follow-up note  Subjective:    Patient ID: Taylor Wilkins, female    DOB: 04-Mar-1946, PCP Stoney Bang, FNP   Past Medical History:  Diagnosis Date   Cancer [facility])    Basal cell on face   Chest pain 08/2008   normal coronary angiogram   Diabetes (HCC)    Hyperlipemia    Hypertension    Leukemia, chronic (HCC)    CLL   Sleep apnea    Past Surgical History:  Procedure Laterality Date   ABDOMINAL HYSTERECTOMY     ANKLE SURGERY  05/13/2004   CATARACT EXTRACTION Bilateral 2023   TONSILLECTOMY  05/14/1955   Social History   Socioeconomic History   Marital status: Married    Spouse name: Not on file   Number of children: Not on file   Years of education: Not on file   Highest education level: Not on file  Occupational History   Not on file  Tobacco Use   Smoking status: Never   Smokeless tobacco: Never  Vaping Use   Vaping status: Never Used  Substance and Sexual Activity   Alcohol use: Never   Drug use: Never   Sexual activity: Not on file  Other Topics Concern   Not on file  Social History Narrative   Not on file   Social Drivers of Health   Financial Resource Strain: Not on file  Food Insecurity: Not on file  Transportation Needs: Not on file  Physical Activity: Not on file  Stress: Not on file  Social Connections: Not on file   Outpatient Encounter Medications as of 06/11/2023  Medication Sig   empagliflozin (JARDIANCE) 10 MG TABS tablet Take 1 tablet (10 mg total) by mouth daily before breakfast.   aspirin 81 MG tablet Take 81 mg by mouth daily.   atorvastatin (LIPITOR) 10 MG tablet Take 1 tablet (10 mg total) by mouth daily.   B Complex Vitamins (VITAMIN B COMPLEX PO) Take 1 tablet by mouth daily.   B-D UF III MINI PEN NEEDLES 31G X 5 MM MISC USE 1 NEEDLE AS DIRECTED   Calcium Citrate-Vitamin D (CALCIUM + D PO) Take by mouth.   cholecalciferol (VITAMIN D) 1000 UNITS tablet Take 1,000 Units by mouth daily.    Continuous Blood Gluc Receiver (FREESTYLE LIBRE 2 READER) DEVI USE AS DIRECTED   Continuous Blood Gluc Sensor (FREESTYLE LIBRE 2 SENSOR) MISC Use to check glucose as directed. Change sensor every 14 days.   diclofenac Sodium (VOLTAREN) 1 % GEL Apply 1 application topically 4 (four) times daily.   escitalopram (LEXAPRO) 5 MG tablet Take 5 mg by mouth daily.   Fish Oil-Cholecalciferol (FISH OIL + D3) 1000-1000 MG-UNIT CAPS Take by mouth.   furosemide (LASIX) 20 MG tablet Take 40 mg by mouth daily as needed.   hydrALAZINE (APRESOLINE) 50 MG tablet Take 1 tablet by mouth 3 (three) times daily.   Ibrutinib (IMBRUVICA) 280 MG TABS Take 1 tablet by mouth daily in the afternoon.   insulin glargine, 1 Unit Dial, (TOUJEO SOLOSTAR) 300 UNIT/ML Solostar Pen Inject 50 Units into the skin at bedtime.   insulin lispro (HUMALOG KWIKPEN) 200 UNIT/ML KwikPen Inject 14-20 Units into the skin 3 (three) times daily with meals.   Insulin Pen Needle (BD PEN NEEDLE NANO U/F) 32G X 4 MM MISC 1 each by Does not apply route 4 (four) times daily.   lisinopril (ZESTRIL) 20 MG tablet Take 40 mg by mouth daily with breakfast.  Magnesium 250 MG TABS every day   pregabalin (LYRICA) 50 MG capsule Lyrica 50 mg capsule  Take 1 capsule 3 times a day by oral route.   Probiotic Product (PROBIOTIC PO) Take 1 tablet by mouth daily in the afternoon.   TURMERIC PO Take 1 tablet by mouth daily.   [DISCONTINUED] empagliflozin (JARDIANCE) 10 MG TABS tablet Take 1 tablet (10 mg total) by mouth daily before breakfast. (Patient not taking: Reported on 06/11/2023)   [DISCONTINUED] insulin lispro (HUMALOG KWIKPEN) 200 UNIT/ML KwikPen Inject 14-20 Units into the skin 3 (three) times daily with meals. (Patient taking differently: Inject 16-20 Units into the skin 3 (three) times daily with meals.)   No facility-administered encounter medications on file as of 06/11/2023.   ALLERGIES: No Known Allergies VACCINATION STATUS: Immunization History   Administered Date(s) Administered   Moderna Sars-Covid-2 Vaccination 06/09/2019, 07/07/2019    Diabetes She presents for her follow-up diabetic visit. She has type 2 diabetes mellitus. Onset time: He was diagnosed at approximate age of 75 years. Her disease course has been worsening. There are no hypoglycemic associated symptoms. Pertinent negatives for hypoglycemia include no confusion, headaches, pallor or seizures. Pertinent negatives for diabetes include no chest pain, no polydipsia, no polyphagia and no polyuria. There are no hypoglycemic complications. Symptoms are worsening. Diabetic complications include retinopathy. Risk factors for coronary artery disease include diabetes mellitus, dyslipidemia, hypertension and tobacco exposure. Current diabetic treatment includes insulin injections and oral agent (dual therapy). She is compliant with treatment most of the time. Her weight is fluctuating minimally. She has had a previous visit with a dietitian. She rarely participates in exercise. Her home blood glucose trend is increasing steadily. Her overall blood glucose range is 140-180 mg/dl. (She has  been distracted and stressed in caring for her husband dealing with stage IV pancreatic cancer.  She has been missing 1-2 of her prandial insulin injection opportunities.  She presents with her CGM device showing 41% time range, 30% level 1 hyperglycemia, 28% level 2 hyperglycemia.   Her average blood glucose is 208 for the last 14 days.  Her point-of-care A1c is 8.2% increasing from 7.8%.    ) An ACE inhibitor/angiotensin II receptor blocker is being taken. Eye exam is current.  Hyperlipidemia This is a chronic problem. The current episode started more than 1 year ago. The problem is controlled. Exacerbating diseases include diabetes and obesity. Pertinent negatives include no chest pain, myalgias or shortness of breath. Current antihyperlipidemic treatment includes statins. Risk factors for coronary  artery disease include diabetes mellitus, obesity, a sedentary lifestyle and post-menopausal.  Hypertension This is a chronic problem. The current episode started more than 1 year ago. The problem is controlled. Pertinent negatives include no chest pain, headaches, palpitations or shortness of breath. Risk factors for coronary artery disease include diabetes mellitus, dyslipidemia, sedentary lifestyle, post-menopausal state and obesity. Past treatments include calcium channel blockers. Hypertensive end-organ damage includes retinopathy.     Review of systems    Objective:    BP 112/64   Pulse 88   Ht 5\' 2"  (1.575 m)   Wt 199 lb 6.4 oz (90.4 kg)   BMI 36.47 kg/m   Wt Readings from Last 3 Encounters:  06/11/23 199 lb 6.4 oz (90.4 kg)  12/10/22 199 lb (90.3 kg)  11/20/22 198 lb 6.4 oz (90 kg)        Results for orders placed or performed in visit on 06/11/23  HgB A1c   Collection Time: 06/11/23  9:42 AM  Result Value Ref Range   Hemoglobin A1C     HbA1c POC (<> result, manual entry)     HbA1c, POC (prediabetic range)     HbA1c, POC (controlled diabetic range) 8.2 (A) 0.0 - 7.0 %   Lipid Panel     Component Value Date/Time   CHOL 171 06/05/2023 0000   TRIG 168 (H) 06/05/2023 0000   HDL 51 06/05/2023 0000   CHOLHDL 3.4 06/05/2023 0000   LDLCALC 91 06/05/2023 0000  30 units twice a day Recent Results (from the past 2160 hours)  Comprehensive metabolic panel     Status: Abnormal   Collection Time: 06/05/23 12:00 AM  Result Value Ref Range   Glucose 158 (H) 70 - 99 mg/dL   BUN 17 8 - 27 mg/dL   Creatinine, Ser 4.78 0.57 - 1.00 mg/dL   eGFR 73 >29 FA/OZH/0.86   BUN/Creatinine Ratio 20 12 - 28   Sodium 142 134 - 144 mmol/L   Potassium 4.5 3.5 - 5.2 mmol/L   Chloride 106 96 - 106 mmol/L   CO2 24 20 - 29 mmol/L   Calcium 8.9 8.7 - 10.3 mg/dL   Total Protein 5.6 (L) 6.0 - 8.5 g/dL   Albumin 3.8 3.8 - 4.8 g/dL   Globulin, Total 1.8 1.5 - 4.5 g/dL   Bilirubin Total 0.3  0.0 - 1.2 mg/dL   Alkaline Phosphatase 87 44 - 121 IU/L   AST 19 0 - 40 IU/L   ALT 18 0 - 32 IU/L  Lipid panel     Status: Abnormal   Collection Time: 06/05/23 12:00 AM  Result Value Ref Range   Cholesterol, Total 171 100 - 199 mg/dL   Triglycerides 578 (H) 0 - 149 mg/dL   HDL 51 >46 mg/dL   VLDL Cholesterol Cal 29 5 - 40 mg/dL   LDL Chol Calc (NIH) 91 0 - 99 mg/dL   Chol/HDL Ratio 3.4 0.0 - 4.4 ratio    Comment:                                   T. Chol/HDL Ratio                                             Men  Women                               1/2 Avg.Risk  3.4    3.3                                   Avg.Risk  5.0    4.4                                2X Avg.Risk  9.6    7.1                                3X Avg.Risk 23.4   11.0   HgB A1c     Status: Abnormal   Collection Time: 06/11/23  9:42 AM  Result Value Ref  Range   Hemoglobin A1C     HbA1c POC (<> result, manual entry)     HbA1c, POC (prediabetic range)     HbA1c, POC (controlled diabetic range) 8.2 (A) 0.0 - 7.0 %     Assessment & Plan:   1. Uncontrolled type 2 diabetes mellitus with complication.  She has  been distracted and stressed in caring for her husband dealing with stage IV pancreatic cancer.  She has been missing 1-2 of her prandial insulin injection opportunities.  She presents with her CGM device showing 41% time range, 30% level 1 hyperglycemia, 28% level 2 hyperglycemia.   Her average blood glucose is 208 for the last 14 days.  Her point-of-care A1c is 8.2% increasing from 7.8%.    Patient remains at a high risk for more acute and chronic complications of diabetes which include CAD, CVA, CKD, retinopathy, and neuropathy. These are all discussed in detail with the patient. Glucose logs and insulin administration records pertaining to this visit,  to be scanned into patient's records.  Recent labs reviewed.   - I have re-counseled the patient on diet management  by adopting a carbohydrate restricted /  protein rich  Diet.   - she acknowledges that there is a room for improvement in her food and drink choices. - Suggestion is made for her to avoid simple carbohydrates  from her diet including Cakes, Sweet Desserts, Ice Cream, Soda (diet and regular), Sweet Tea, Candies, Chips, Cookies, Store Bought Juices, Alcohol in Excess of  1-2 drinks a day, Artificial Sweeteners,  Coffee Creamer, and "Sugar-free" Products, Lemonade. This will help patient to have more stable blood glucose profile and potentially avoid unintended weight gain. - Patient is advised to stick to a routine mealtimes to eat 3 meals  a day and avoid unnecessary snacks ( to snack only to correct hypoglycemia).  - I have approached patient with the following individualized plan to manage diabetes and patient agrees.  - She we will continue to need intensive treatment with basal/bolus insulin in order for her to maintain control of diabetes to target.   -Avoiding hypoglycemia as a priority in this patient.  -She is willing and advised as much as she can while taking care of her ailing husband, to continue Toujeo 50 units nightly, advised to lower Humalog to 14-20  units 3 times a day before meals for pre-meal glucose above 90 mg/dL,  associated with strict documenting blood glucose at least 4 times a day-before meals and at bedtime.   -She is wearing the Libre CGM device-advised to wear it at all times.    -Patient is encouraged to call clinic for blood glucose levels less than 70 or above 200 mg /dl x 3 readings in a row.  -  She will stay off of metformin for now.   -She was not able to procure her Jardiance prescribed during her last visit, requesting prescription to be sent to her pharmacy 1 more time.  I discussed and prescribed Jardiance 10 mg p.o. daily at breakfast.  Side effects and precautions discussed with her.    Her anti-GAD and anti-islet cell antibodies are negative.  2) BP/HTN:  Her blood pressure is controlled to  target.  She is advised to increase her lisinopril to 20 mg p.o. daily at breakfast.  She also takes verapamil 240 mg p.o. daily.   3) Lipids/HPL: Her recent lipid panel showed LDL increasing to 91 after overall improvement from 139.    She is advised  to continue atorvastatin 10 mg p.o. nightly.   Side effects and precautions discussed with her.     4)  Weight/Diet:  Her BMI is 36.47-complicating her diabetes care.  She is a candidate for modest weight loss.  CDE consult in progress, exercise, and carbohydrates information provided. -Her thyroid function test is within normal limits.     5) Chronic Care/Health Maintenance:  -Patient is on ACEI/ARB and Statin medications and encouraged to continue to follow up with Ophthalmology, Podiatrist at least yearly or according to recommendations, and advised to  stay away from smoking. I have recommended yearly flu vaccine and pneumonia vaccination at least every 5 years; moderate intensity exercise for up to 150 minutes weekly; and  sleep for at least 7 hours a day.  Her diabetes foot exam was significant for mild neuropathy. Her screening ABI was normal in December 2021, her next study will be due in December 2026, or sooner if needed.   - I advised patient to maintain close follow up with her PCP for primary care needs.   I spent  26  minutes in the care of the patient today including review of labs from CMP, Lipids, Thyroid Function, Hematology (current and previous including abstractions from other facilities); face-to-face time discussing  her blood glucose readings/logs, discussing hypoglycemia and hyperglycemia episodes and symptoms, medications doses, her options of short and long term treatment based on the latest standards of care / guidelines;  discussion about incorporating lifestyle medicine;  and documenting the encounter. Risk reduction counseling performed per USPSTF guidelines to reduce  obesity and cardiovascular risk factors.      Please refer to Patient Instructions for Blood Glucose Monitoring and Insulin/Medications Dosing Guide"  in media tab for additional information. Please  also refer to " Patient Self Inventory" in the Media  tab for reviewed elements of pertinent patient history.  Taylor Wilkins participated in the discussions, expressed understanding, and voiced agreement with the above plans.  All questions were answered to her satisfaction. she is encouraged to contact clinic should she have any questions or concerns prior to her return visit.   Follow up plan: -Return in about 3 months (around 09/09/2023) for Bring Meter/CGM Device/Logs- A1c in Office.  Marquis Lunch, MD Phone: [phone]  Fax: [phone]  This note was partially dictated with voice recognition software. Similar sounding words can be transcribed inadequately or may not  be corrected upon review.  06/11/2023, 9:56 AM

## 2023-06-11 NOTE — Patient Instructions (Signed)

## 2023-06-12 ENCOUNTER — Ambulatory Visit: Payer: Medicare Other | Admitting: "Endocrinology

## 2023-07-14 ENCOUNTER — Other Ambulatory Visit: Payer: Self-pay | Admitting: "Endocrinology

## 2023-07-14 DIAGNOSIS — E1165 Type 2 diabetes mellitus with hyperglycemia: Secondary | ICD-10-CM

## 2023-10-27 ENCOUNTER — Encounter (INDEPENDENT_AMBULATORY_CARE_PROVIDER_SITE_OTHER): Payer: Medicare Other | Admitting: Ophthalmology

## 2023-10-27 DIAGNOSIS — E113393 Type 2 diabetes mellitus with moderate nonproliferative diabetic retinopathy without macular edema, bilateral: Secondary | ICD-10-CM

## 2023-10-27 DIAGNOSIS — Z794 Long term (current) use of insulin: Secondary | ICD-10-CM | POA: Diagnosis not present

## 2023-10-27 DIAGNOSIS — H43813 Vitreous degeneration, bilateral: Secondary | ICD-10-CM

## 2023-10-27 DIAGNOSIS — H353132 Nonexudative age-related macular degeneration, bilateral, intermediate dry stage: Secondary | ICD-10-CM

## 2023-10-27 DIAGNOSIS — I10 Essential (primary) hypertension: Secondary | ICD-10-CM

## 2023-10-27 DIAGNOSIS — H35371 Puckering of macula, right eye: Secondary | ICD-10-CM

## 2023-10-27 DIAGNOSIS — H2513 Age-related nuclear cataract, bilateral: Secondary | ICD-10-CM

## 2023-10-27 DIAGNOSIS — H35033 Hypertensive retinopathy, bilateral: Secondary | ICD-10-CM

## 2023-11-24 ENCOUNTER — Ambulatory Visit: Attending: Cardiovascular Disease | Admitting: Cardiovascular Disease

## 2023-11-24 ENCOUNTER — Encounter: Payer: Self-pay | Admitting: Cardiovascular Disease

## 2023-11-24 VITALS — BP 136/64 | HR 78 | Ht 63.0 in | Wt 191.8 lb

## 2023-11-24 DIAGNOSIS — E782 Mixed hyperlipidemia: Secondary | ICD-10-CM | POA: Diagnosis present

## 2023-11-24 DIAGNOSIS — I10 Essential (primary) hypertension: Secondary | ICD-10-CM | POA: Diagnosis not present

## 2023-11-24 NOTE — Patient Instructions (Signed)
 Medication Instructions:  Your physician recommends that you continue on your current medications as directed. Please refer to the Current Medication list given to you today.  *If you need a refill on your cardiac medications before your next appointment, please call your pharmacy*   Follow-Up: At [facility], you and your health needs are our priority.  As part of our continuing mission to provide you with exceptional heart care, our providers are all part of one team.  This team includes your primary Cardiologist (physician) and Advanced Practice Providers or APPs (Physician Assistants and Nurse Practitioners) who all work together to provide you with the care you need, when you need it.  Your next appointment:   12 month(s)  Provider:   Damien Braver, NP         Then, Dorn Lesches, MD will plan to see you again in 2 year(s).

## 2023-11-24 NOTE — Assessment & Plan Note (Signed)
 History of hyperlipidemia on low-dose atorvastatin  with lipid profile performed 06/05/2023 revealing total cholesterol of 171, LDL 91 and HDL 51.

## 2023-11-24 NOTE — Assessment & Plan Note (Signed)
 History of essential hypertension with blood pressure measured today 136/64.  She is on hydralazine and lisinopril.

## 2023-11-24 NOTE — Progress Notes (Signed)
 11/24/2023 Taylor Wilkins   Nov 27, 1945  [id number]  Primary Physician Mikle Beau, FNP Primary Cardiologist: Dorn JINNY Lesches MD [facility] [place], [place]  HPI:  Taylor Wilkins is a 78 y.o.  married Caucasian female mother of 3, grandmother and 6 grandchildren referred by Beau Mock FNP in [place] to be reestablished our practice. I last saw her in the office   10/16/2021. She had previously seen Dr. Leafy in our practice years ago and apparently had a normal cardiac catheterization after false positive GXT. 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  She has cardiac risk factors notable for 2 hypertension and hyperlipidemia as well as diabetes. She does not smoke. Her mother did die of a myocardial infarction at age 62. She had new onset dyspnea on exertion over several months prior to her last office visit which prompted obtaining a 2-D echo and a Myoview  stress test on 11/08/16 which were normal. Her symptoms have since resolved. She has had CLL for last 3 years and has recently finished chemotherapy for this.  This resulted in her beginning insulin  and steroids which has apparently led to a 15 pound weight gain.   Since I saw her in the office 2 years ago she remained stable.  Her CLL has remained stable as well.  She denies chest pain or shortness of breath.   Current Meds  Medication Sig   aspirin 81 MG tablet Take 81 mg by mouth daily.   atorvastatin  (LIPITOR) 10 MG tablet Take 1 tablet (10 mg total) by mouth daily.   B Complex Vitamins (VITAMIN B COMPLEX PO) Take 1 tablet by mouth daily.   B-D UF III MINI PEN NEEDLES 31G X 5 MM MISC USE 1 NEEDLE AS DIRECTED   Calcium  Citrate-Vitamin D  (CALCIUM  + D PO) Take by mouth.   cholecalciferol (VITAMIN D ) 1000 UNITS tablet Take 1,000 Units by mouth daily.   Continuous Blood Gluc Receiver (FREESTYLE LIBRE 2 READER) DEVI USE AS DIRECTED   Continuous Blood Gluc Sensor (FREESTYLE  LIBRE 2 SENSOR) MISC Use to check glucose as directed. Change sensor every 14 days.   diclofenac Sodium (VOLTAREN) 1 % GEL Apply 1 application topically 4 (four) times daily.   empagliflozin  (JARDIANCE ) 10 MG TABS tablet Take 1 tablet (10 mg total) by mouth daily before breakfast.   escitalopram (LEXAPRO) 5 MG tablet Take 5 mg by mouth daily.   Fish Oil-Cholecalciferol (FISH OIL + D3) 1000-1000 MG-UNIT CAPS Take by mouth.   furosemide (LASIX) 20 MG tablet Take 40 mg by mouth daily as needed.   HUMALOG  KWIKPEN 200 UNIT/ML KwikPen INJECT 14 TO 20 UNITS UNDER THE SKIN THREE TIMES A DAY WITH FOOD   hydrALAZINE (APRESOLINE) 50 MG tablet Take 50 mg by mouth 3 (three) times daily.   Ibrutinib (IMBRUVICA) 280 MG TABS Take 1 tablet by mouth daily in the afternoon.   insulin  glargine, 1 Unit Dial , (TOUJEO  SOLOSTAR) 300 UNIT/ML Solostar Pen Inject 50 Units into the skin at bedtime.   Insulin  Pen Needle (BD PEN NEEDLE NANO U/F) 32G X 4 MM MISC 1 each by Does not apply route 4 (four) times daily.   lisinopril (ZESTRIL) 20 MG tablet Take 40 mg by mouth daily with breakfast.   Magnesium 250 MG TABS every day   pregabalin (LYRICA) 50 MG capsule Lyrica 50 mg capsule  Take 1 capsule 3 times a day by oral route.   Probiotic  Product (PROBIOTIC PO) Take 1 tablet by mouth daily in the afternoon.   TURMERIC PO Take 1 tablet by mouth daily.     No Known Allergies  Social History   Socioeconomic History   Marital status: Married    Spouse name: Not on file   Number of children: Not on file   Years of education: Not on file   Highest education level: Not on file  Occupational History   Not on file  Tobacco Use   Smoking status: Never   Smokeless tobacco: Never  Vaping Use   Vaping status: Never Used  Substance and Sexual Activity   Alcohol use: Never   Drug use: Never   Sexual activity: Not on file  Other Topics Concern   Not on file  Social History Narrative   Not on file   Social Drivers of  Health   Financial Resource Strain: Not on file  Food Insecurity: Not on file  Transportation Needs: Not on file  Physical Activity: Not on file  Stress: Not on file  Social Connections: Not on file  Intimate Partner Violence: Not on file     Review of Systems: General: negative for chills, fever, night sweats or weight changes.  Cardiovascular: negative for chest pain, dyspnea on exertion, edema, orthopnea, palpitations, paroxysmal nocturnal dyspnea or shortness of breath Dermatological: negative for rash Respiratory: negative for cough or wheezing Urologic: negative for hematuria Abdominal: negative for nausea, vomiting, diarrhea, bright red blood per rectum, melena, or hematemesis Neurologic: negative for visual changes, syncope, or dizziness All other systems reviewed and are otherwise negative except as noted above.    Blood pressure 136/64, pulse 78, height 5' 3 (1.6 m), weight 191 lb 12.8 oz (87 kg), SpO2 99%.  General appearance: alert and no distress Neck: no adenopathy, no carotid bruit, no JVD, supple, symmetrical, trachea midline, and thyroid not enlarged, symmetric, no tenderness/mass/nodules Lungs: clear to auscultation bilaterally Heart: regular rate and rhythm, S1, S2 normal, no murmur, click, rub or gallop Extremities: extremities normal, atraumatic, no cyanosis or edema Pulses: 2+ and symmetric Skin: Skin color, texture, turgor normal. No rashes or lesions Neurologic: Grossly normal  EKG EKG Interpretation Date/Time:  Monday November 24 2023 10:05:25 EDT Ventricular Rate:  78 PR Interval:  156 QRS Duration:  88 QT Interval:  402 QTC Calculation: 458 R Axis:   -39  Text Interpretation: Normal sinus rhythm Left axis deviation Anterior infarct , age undetermined When compared with ECG of 20-Nov-2022 15:17, QRS axis Shifted left Confirmed by Court Carrier [id number]) on 11/24/2023 10:17:13 AM    ASSESSMENT AND PLAN:   Mixed hyperlipidemia History of  hyperlipidemia on low-dose atorvastatin  with lipid profile performed 06/05/2023 revealing total cholesterol of 171, LDL 91 and HDL 51.  Essential hypertension, benign History of essential hypertension with blood pressure measured today 136/64.  She is on hydralazine and lisinopril.     Carrier DOROTHA Court MD FACP,FACC,FAHA, [facility] 11/24/2023 10:23 AM

## 2023-12-08 ENCOUNTER — Other Ambulatory Visit: Payer: Self-pay | Admitting: "Endocrinology

## 2023-12-09 ENCOUNTER — Ambulatory Visit (INDEPENDENT_AMBULATORY_CARE_PROVIDER_SITE_OTHER): Payer: Medicare Other | Admitting: "Endocrinology

## 2023-12-09 ENCOUNTER — Encounter: Payer: Self-pay | Admitting: "Endocrinology

## 2023-12-09 VITALS — BP 122/58 | HR 72 | Ht 62.0 in | Wt 189.6 lb

## 2023-12-09 DIAGNOSIS — E6609 Other obesity due to excess calories: Secondary | ICD-10-CM

## 2023-12-09 DIAGNOSIS — E782 Mixed hyperlipidemia: Secondary | ICD-10-CM

## 2023-12-09 DIAGNOSIS — E66811 Obesity, class 1: Secondary | ICD-10-CM | POA: Insufficient documentation

## 2023-12-09 DIAGNOSIS — Z794 Long term (current) use of insulin: Secondary | ICD-10-CM | POA: Diagnosis not present

## 2023-12-09 DIAGNOSIS — E1165 Type 2 diabetes mellitus with hyperglycemia: Secondary | ICD-10-CM

## 2023-12-09 DIAGNOSIS — Z6834 Body mass index (BMI) 34.0-34.9, adult: Secondary | ICD-10-CM

## 2023-12-09 DIAGNOSIS — I10 Essential (primary) hypertension: Secondary | ICD-10-CM

## 2023-12-09 LAB — POCT GLYCOSYLATED HEMOGLOBIN (HGB A1C): HbA1c, POC (controlled diabetic range): 9.5 % — AB (ref 0.0–7.0)

## 2023-12-09 MED ORDER — FREESTYLE LIBRE 3 READER DEVI: 1.0000 | Freq: Once | 0 refills | Status: AC | PRN

## 2023-12-09 MED ORDER — FREESTYLE LIBRE 3 PLUS SENSOR MISC: 3 refills | Status: AC

## 2023-12-09 MED ORDER — HUMALOG KWIKPEN 200 UNIT/ML ~~LOC~~ SOPN: 10.0000 [IU] | PEN_INJECTOR | Freq: Three times a day (TID) | SUBCUTANEOUS | 1 refills | Status: DC

## 2023-12-09 MED ORDER — TIRZEPATIDE 2.5 MG/0.5ML ~~LOC~~ SOAJ
2.5000 mg | SUBCUTANEOUS | 0 refills | Status: DC
Start: 2023-12-09 — End: 2024-03-24

## 2023-12-09 NOTE — Progress Notes (Signed)
 12/09/2023   Endocrinology follow-up note  Subjective:    Patient ID: Taylor Wilkins, female    DOB: June 20, 1945, PCP Mikle Beau, FNP   Past Medical History:  Diagnosis Date   Cancer [facility])    Basal cell on face   Chest pain 08/2008   normal coronary angiogram   Diabetes (HCC)    Hyperlipemia    Hypertension    Leukemia, chronic (HCC)    CLL   Sleep apnea    Past Surgical History:  Procedure Laterality Date   ABDOMINAL HYSTERECTOMY     ANKLE SURGERY  05/13/2004   CATARACT EXTRACTION Bilateral 2023   TONSILLECTOMY  05/14/1955   Social History   Socioeconomic History   Marital status: Married    Spouse name: Not on file   Number of children: Not on file   Years of education: Not on file   Highest education level: Not on file  Occupational History   Not on file  Tobacco Use   Smoking status: Never   Smokeless tobacco: Never  Vaping Use   Vaping status: Never Used  Substance and Sexual Activity   Alcohol use: Never   Drug use: Never   Sexual activity: Not on file  Other Topics Concern   Not on file  Social History Narrative   Not on file   Social Drivers of Health   Financial Resource Strain: Not on file  Food Insecurity: Not on file  Transportation Needs: Not on file  Physical Activity: Not on file  Stress: Not on file  Social Connections: Not on file   Outpatient Encounter Medications as of 12/09/2023  Medication Sig   Continuous Glucose Receiver (FREESTYLE LIBRE 3 READER) DEVI 1 Piece by Does not apply route once as needed for up to 1 dose.   Continuous Glucose Sensor (FREESTYLE LIBRE 3 PLUS SENSOR) MISC Change sensor every 15 days.   tirzepatide  (MOUNJARO ) 2.5 MG/0.5ML Pen Inject 2.5 mg into the skin once a week.   aspirin 81 MG tablet Take 81 mg by mouth daily.   atorvastatin  (LIPITOR) 10 MG tablet Take 1 tablet (10 mg total) by mouth daily.   B Complex Vitamins (VITAMIN B COMPLEX PO) Take 1 tablet by mouth daily.   B-D UF III MINI PEN  NEEDLES 31G X 5 MM MISC USE 1 NEEDLE AS DIRECTED   Calcium  Citrate-Vitamin D  (CALCIUM  + D PO) Take by mouth.   cholecalciferol (VITAMIN D ) 1000 UNITS tablet Take 1,000 Units by mouth daily.   Continuous Blood Gluc Receiver (FREESTYLE LIBRE 2 READER) DEVI USE AS DIRECTED   Continuous Blood Gluc Sensor (FREESTYLE LIBRE 2 SENSOR) MISC Use to check glucose as directed. Change sensor every 14 days.   diclofenac Sodium (VOLTAREN) 1 % GEL Apply 1 application topically 4 (four) times daily.   escitalopram (LEXAPRO) 5 MG tablet Take 5 mg by mouth daily.   Fish Oil-Cholecalciferol (FISH OIL + D3) 1000-1000 MG-UNIT CAPS Take by mouth.   furosemide (LASIX) 20 MG tablet Take 40 mg by mouth daily as needed.   hydrALAZINE (APRESOLINE) 50 MG tablet Take 50 mg by mouth 3 (three) times daily.   Ibrutinib (IMBRUVICA) 280 MG TABS Take 1 tablet by mouth daily in the afternoon.   insulin  glargine, 1 Unit Dial , (TOUJEO  SOLOSTAR) 300 UNIT/ML Solostar Pen Inject 50 Units into the skin at bedtime.   insulin  lispro (HUMALOG  KWIKPEN) 200 UNIT/ML KwikPen Inject 10-16 Units into the skin 3 (three) times daily before meals.   Insulin  Pen  Needle (BD PEN NEEDLE NANO U/F) 32G X 4 MM MISC 1 each by Does not apply route 4 (four) times daily.   lisinopril (ZESTRIL) 20 MG tablet Take 40 mg by mouth daily with breakfast.   Magnesium 250 MG TABS every day   pregabalin (LYRICA) 50 MG capsule Lyrica 50 mg capsule  Take 1 capsule 3 times a day by oral route.   Probiotic Product (PROBIOTIC PO) Take 1 tablet by mouth daily in the afternoon.   TURMERIC PO Take 1 tablet by mouth daily.   [DISCONTINUED] HUMALOG  KWIKPEN 200 UNIT/ML KwikPen INJECT 14 TO 20 UNITS UNDER THE SKIN THREE TIMES A DAY WITH FOOD   [DISCONTINUED] JARDIANCE  10 MG TABS tablet TAKE 1 TABLET BY MOUTH DAILY BEFORE BREAKFAST (Patient not taking: Reported on 12/09/2023)   No facility-administered encounter medications on file as of 12/09/2023.   ALLERGIES: No Known  Allergies VACCINATION STATUS: Immunization History  Administered Date(s) Administered   Moderna Sars-Covid-2 Vaccination 06/09/2019, 07/07/2019    Diabetes She presents for her follow-up diabetic visit. She has type 2 diabetes mellitus. Onset time: He was diagnosed at approximate age of 70 years. Her disease course has been worsening. There are no hypoglycemic associated symptoms. Pertinent negatives for hypoglycemia include no confusion, headaches, pallor or seizures. Pertinent negatives for diabetes include no chest pain, no polydipsia, no polyphagia and no polyuria. There are no hypoglycemic complications. Symptoms are worsening. Diabetic complications include retinopathy. Risk factors for coronary artery disease include diabetes mellitus, dyslipidemia, hypertension and tobacco exposure. Current diabetic treatment includes insulin  injections and oral agent (dual therapy). She is compliant with treatment most of the time. Her weight is fluctuating minimally. She has had a previous visit with a dietitian. She rarely participates in exercise. Her home blood glucose trend is increasing steadily. Her breakfast blood glucose range is generally 180-200 mg/dl. Her lunch blood glucose range is generally >200 mg/dl. Her dinner blood glucose range is generally >200 mg/dl. Her bedtime blood glucose range is generally >200 mg/dl. Her overall blood glucose range is >200 mg/dl. (She is dealing with the grief of losing her husband who died of pancreatic cancer.  Her AGP report shows average blood glucose of 229 for the most recent 14 days.  Her AGP report shows 30% time in range, 28% level 1 hyperglycemia, 41% daily to hyperglycemia.  She did not have significant hypoglycemia.  She did not tolerate Jardiance  which caused frequent yeast infection and did not continue with it.   Her point-of-care  A1c 9.5% today  Increasing from 8.2% during her last visit.    ) An ACE inhibitor/angiotensin II receptor blocker is being  taken. Eye exam is current.  Hyperlipidemia This is a chronic problem. The current episode started more than 1 year ago. The problem is controlled. Exacerbating diseases include diabetes and obesity. Pertinent negatives include no chest pain, myalgias or shortness of breath. Current antihyperlipidemic treatment includes statins. Risk factors for coronary artery disease include diabetes mellitus, obesity, a sedentary lifestyle and post-menopausal.  Hypertension This is a chronic problem. The current episode started more than 1 year ago. The problem is controlled. Pertinent negatives include no chest pain, headaches, palpitations or shortness of breath. Risk factors for coronary artery disease include diabetes mellitus, dyslipidemia, sedentary lifestyle, post-menopausal state and obesity. Past treatments include calcium  channel blockers. Hypertensive end-organ damage includes retinopathy.     Review of systems    Objective:    BP (!) 122/58   Pulse 72   Ht 5' 2 (  1.575 m)   Wt 189 lb 9.6 oz (86 kg)   BMI 34.68 kg/m   Wt Readings from Last 3 Encounters:  12/09/23 189 lb 9.6 oz (86 kg)  11/24/23 191 lb 12.8 oz (87 kg)  06/11/23 199 lb 6.4 oz (90.4 kg)        Results for orders placed or performed in visit on 12/09/23  HgB A1c   Collection Time: 12/09/23 10:21 AM  Result Value Ref Range   Hemoglobin A1C     HbA1c POC (<> result, manual entry)     HbA1c, POC (prediabetic range)     HbA1c, POC (controlled diabetic range) 9.5 (A) 0.0 - 7.0 %   Lipid Panel     Component Value Date/Time   CHOL 171 06/05/2023 0000   TRIG 168 (H) 06/05/2023 0000   HDL 51 06/05/2023 0000   CHOLHDL 3.4 06/05/2023 0000   LDLCALC 91 06/05/2023 0000  30 units twice a day Recent Results (from the past 2160 hours)  HgB A1c     Status: Abnormal   Collection Time: 12/09/23 10:21 AM  Result Value Ref Range   Hemoglobin A1C     HbA1c POC (<> result, manual entry)     HbA1c, POC (prediabetic range)      HbA1c, POC (controlled diabetic range) 9.5 (A) 0.0 - 7.0 %     Assessment & Plan:   1. Uncontrolled type 2 diabetes mellitus with complication.  She is dealing with the grief of losing her husband who died of pancreatic cancer.  Her AGP report shows average blood glucose of 229 for the most recent 14 days.  Her AGP report shows 30% time in range, 28% level 1 hyperglycemia, 41% daily to hyperglycemia.  She did not have significant hypoglycemia.  She did not tolerate Jardiance  which caused frequent yeast infection and did not continue with it.   Her point-of-care  A1c 9.5% today  Increasing from 8.2% during her last visit.    Patient remains at a high risk for more acute and chronic complications of diabetes which include CAD, CVA, CKD, retinopathy, and neuropathy. These are all discussed in detail with the patient. Glucose logs and insulin  administration records pertaining to this visit,  to be scanned into patient's records.  Recent labs reviewed.   - I have re-counseled the patient on diet management  by adopting a carbohydrate restricted / protein rich  Diet.   - she acknowledges that there is a room for improvement in her food and drink choices. - Suggestion is made for her to avoid simple carbohydrates  from her diet including Cakes, Sweet Desserts, Ice Cream, Soda (diet and regular), Sweet Tea, Candies, Chips, Cookies, Store Bought Juices, Alcohol in Excess of  1-2 drinks a day, Artificial Sweeteners,  Coffee Creamer, and Sugar-free Products, Lemonade. This will help patient to have more stable blood glucose profile and potentially avoid unintended weight gain. - Patient is advised to stick to a routine mealtimes to eat 3 meals  a day and avoid unnecessary snacks ( to snack only to correct hypoglycemia).  - I have approached patient with the following individualized plan to manage diabetes and patient agrees.  - She we will continue to need intensive treatment with basal/bolus insulin   in order for her to maintain control of diabetes to target.   -Avoiding hypoglycemia as a priority in this patient.  -She is willing and advised to continue Toujeo  50 units nightly, advised to lower Humalog  to 10-16  units 3  times a day before meals for pre-meal glucose above 90 mg/dL,  associated with strict documenting blood glucose at least 4 times a day-before meals and at bedtime.   -I discussed and upgraded her CGM to freestyle libre 3+.  -Patient is encouraged to call clinic for blood glucose levels less than 70 or above 200 mg /dl x 3 readings in a row.  -  She will stay off of metformin for now.  She did not tolerate Jardiance .  She may benefit from GLP-1 receptor agonists.  I discussed and prescribed Mounjaro  2.5 mg subcutaneously weekly.  Side effects and precautions discussed with her.  This medication will be slowly advanced to higher standard doses as she tolerates.   Her anti-GAD and anti-islet cell antibodies are negative.  2) BP/HTN:  Her blood pressure is controlled to target.  She is advised to increase her lisinopril to 20 mg p.o. daily at breakfast.  She also takes verapamil 240 mg p.o. daily.   3) Lipids/HPL: Her recent lipid panel showed LDL increasing to 91 after overall improvement from 139.   She is advised to continue atorvastatin  10 mg p.o. daily.  She will have fasting lipid panel before her next visit.     Side effects and precautions discussed with her.     4)  Weight/Diet:  Her BMI is 34.58--complicating her diabetes care.  She is a candidate for modest weight loss.  CDE consult in progress, exercise, and carbohydrates information provided. -Her thyroid function test is within normal limits.     5) Chronic Care/Health Maintenance:  -Patient is on ACEI/ARB and Statin medications and encouraged to continue to follow up with Ophthalmology, Podiatrist at least yearly or according to recommendations, and advised to  stay away from smoking. I have recommended  yearly flu vaccine and pneumonia vaccination at least every 5 years; moderate intensity exercise for up to 150 minutes weekly; and  sleep for at least 7 hours a day.  Her diabetes foot exam was significant for mild neuropathy-December 09, 2023. Her screening ABI was normal in December 2021, her next study will be due in December 2026, or sooner if needed.   - I advised patient to maintain close follow up with her PCP for primary care needs.   I spent  41  minutes in the care of the patient today including review of labs from CMP, Lipids, Thyroid Function, Hematology (current and previous including abstractions from other facilities); face-to-face time discussing  her blood glucose readings/logs, discussing hypoglycemia and hyperglycemia episodes and symptoms, medications doses, her options of short and long term treatment based on the latest standards of care / guidelines;  discussion about incorporating lifestyle medicine;  and documenting the encounter. Risk reduction counseling performed per USPSTF guidelines to reduce  obesity and cardiovascular risk factors.     Please refer to Patient Instructions for Blood Glucose Monitoring and Insulin /Medications Dosing Guide  in media tab for additional information. Please  also refer to  Patient Self Inventory in the Media  tab for reviewed elements of pertinent patient history.  Taylor Wilkins participated in the discussions, expressed understanding, and voiced agreement with the above plans.  All questions were answered to her satisfaction. she is encouraged to contact clinic should she have any questions or concerns prior to her return visit.    Follow up plan: -Return in about 4 months (around 04/10/2024) for F/U with Pre-visit Labs, Meter/CGM/Logs, A1c here.  Ranny Earl, MD Phone: [phone]  Fax: [phone]  This  note was partially dictated with voice recognition software. Similar sounding words can be transcribed inadequately or may not  be  corrected upon review.  12/09/2023, 12:28 PM

## 2023-12-09 NOTE — Patient Instructions (Signed)

## 2023-12-11 ENCOUNTER — Telehealth: Payer: Self-pay

## 2023-12-11 NOTE — Telephone Encounter (Signed)
 Pharmacy Patient Advocate Encounter   Received notification from CoverMyMeds that prior authorization for freestyle libre 3 plus sensor is required/requested.   Insurance verification completed.   The patient is insured through [facility] .   Per test claim: PA required; PA submitted to above mentioned insurance via CoverMyMeds Key/confirmation #/EOC [id number] Status is pending

## 2023-12-15 ENCOUNTER — Telehealth: Payer: Self-pay | Admitting: "Endocrinology

## 2023-12-15 NOTE — Telephone Encounter (Signed)
 Spoke with pt

## 2023-12-15 NOTE — Telephone Encounter (Signed)
 Pt called back requesting a call back to [phone]

## 2023-12-15 NOTE — Telephone Encounter (Signed)
 Left a message requesting pt return call to the office.

## 2023-12-15 NOTE — Telephone Encounter (Signed)
 Pharmacy Patient Advocate Encounter  Received notification from [employer] that Prior Authorization for Freestyle libre 3 plus sensor has been CANCELLED, medication is not eligible for pharmacy benefits, medication must be billed through medical insurance. As our team currently only handles pharmacy related PA's, medical PA's must be submitted by the clinic.   PA #/Case ID/Reference #: [id number]

## 2023-12-15 NOTE — Telephone Encounter (Signed)
 Spoke with pt advising her the [place] 3 Plus sensor are not covered by her insurance through pharmacy benefits. Pt stated she received her Libre 2 CGM system through the DME company [employer]. States her daughter has contacted them and they are going to fax a request to our office.

## 2024-01-03 ENCOUNTER — Other Ambulatory Visit: Payer: Self-pay | Admitting: "Endocrinology

## 2024-01-03 DIAGNOSIS — E1165 Type 2 diabetes mellitus with hyperglycemia: Secondary | ICD-10-CM

## 2024-02-10 ENCOUNTER — Telehealth: Payer: Self-pay

## 2024-02-10 NOTE — Telephone Encounter (Signed)
 Pt called stating she started injecting Mounjaro  weekly 4wks ago. States she is not able to tolerate the Mounjaro , she is experiencing symptoms of a yeast infection. States she did stop the Mounjaro  and her PCP ordered Monistat. Pt asked if there is any other medication to replace Mounjaro .

## 2024-02-12 NOTE — Telephone Encounter (Signed)
 Pt has called back asking about this message

## 2024-02-13 NOTE — Telephone Encounter (Signed)
 Spoke with pt advising her Mounjaro  is unlikely to cause the yeast infection per Dr.Nida. Advised pt Dr.Nida still feels she would benefit from the Mounjaro  and suggests she restart it. Pt stated she is willing to try the Mounjaro  again but did state the area where she noticed the yeast infection was in her skin folds around her panty line and has improved since she did not take her Mounjaro  injection this past Sunday. I advised pt I would make Dr.Nida aware.

## 2024-03-22 ENCOUNTER — Other Ambulatory Visit: Payer: Self-pay | Admitting: "Endocrinology

## 2024-04-05 ENCOUNTER — Other Ambulatory Visit: Payer: Self-pay | Admitting: "Endocrinology

## 2024-04-13 ENCOUNTER — Ambulatory Visit (INDEPENDENT_AMBULATORY_CARE_PROVIDER_SITE_OTHER): Admitting: "Endocrinology

## 2024-04-13 ENCOUNTER — Encounter: Payer: Self-pay | Admitting: "Endocrinology

## 2024-04-13 VITALS — BP 144/66 | HR 84 | Ht 62.0 in | Wt 182.4 lb

## 2024-04-13 DIAGNOSIS — E66811 Obesity, class 1: Secondary | ICD-10-CM

## 2024-04-13 DIAGNOSIS — E1165 Type 2 diabetes mellitus with hyperglycemia: Secondary | ICD-10-CM | POA: Diagnosis not present

## 2024-04-13 DIAGNOSIS — E782 Mixed hyperlipidemia: Secondary | ICD-10-CM | POA: Diagnosis not present

## 2024-04-13 DIAGNOSIS — I10 Essential (primary) hypertension: Secondary | ICD-10-CM | POA: Diagnosis not present

## 2024-04-13 DIAGNOSIS — Z6834 Body mass index (BMI) 34.0-34.9, adult: Secondary | ICD-10-CM

## 2024-04-13 DIAGNOSIS — Z794 Long term (current) use of insulin: Secondary | ICD-10-CM

## 2024-04-13 DIAGNOSIS — E6609 Other obesity due to excess calories: Secondary | ICD-10-CM

## 2024-04-13 LAB — LIPID PANEL
Chol/HDL Ratio: 3.7 ratio (ref 0.0–4.4)
Cholesterol, Total: 161 mg/dL (ref 100–199)
HDL: 44 mg/dL (ref >39)
LDL Chol Calc (NIH): 90 mg/dL (ref 0–99)
Triglycerides: 158 mg/dL — ABNORMAL HIGH (ref 0–149)
VLDL Cholesterol Cal: 27 mg/dL (ref 5–40)

## 2024-04-13 LAB — POCT GLYCOSYLATED HEMOGLOBIN (HGB A1C): HbA1c, POC (controlled diabetic range): 7.8 % — AB (ref 0.0–7.0)

## 2024-04-13 LAB — COMPREHENSIVE METABOLIC PANEL WITH GFR
ALT: 12 IU/L (ref 0–32)
AST: 16 IU/L (ref 0–40)
Albumin: 4.1 g/dL (ref 3.8–4.8)
Alkaline Phosphatase: 69 IU/L (ref 49–135)
BUN/Creatinine Ratio: 30 — ABNORMAL HIGH (ref 12–28)
BUN: 25 mg/dL (ref 8–27)
Bilirubin Total: 0.3 mg/dL (ref 0.0–1.2)
CO2: 27 mmol/L (ref 20–29)
Calcium: 9.4 mg/dL (ref 8.7–10.3)
Chloride: 103 mmol/L (ref 96–106)
Creatinine, Ser: 0.84 mg/dL (ref 0.57–1.00)
Globulin, Total: 1.5 g/dL (ref 1.5–4.5)
Glucose: 160 mg/dL — ABNORMAL HIGH (ref 70–99)
Potassium: 4.6 mmol/L (ref 3.5–5.2)
Sodium: 139 mmol/L (ref 134–144)
Total Protein: 5.6 g/dL — ABNORMAL LOW (ref 6.0–8.5)
eGFR: 72 mL/min/1.73 (ref >59)

## 2024-04-13 LAB — FIB-4 W/REFLEX TO ELF
FIB-4 Index: 1.46 (ref 0.00–2.67)
Platelets: 244 x10E3/uL (ref 150–450)

## 2024-04-13 LAB — ENHANCED LIVER FIBROSIS (ELF): ELF(TM) Score: 9.7 (ref <9.80)

## 2024-04-13 MED ORDER — TIRZEPATIDE 5 MG/0.5ML ~~LOC~~ SOAJ: 5.0000 mg | SUBCUTANEOUS | 1 refills | Status: AC

## 2024-04-13 NOTE — Patient Instructions (Signed)

## 2024-04-13 NOTE — Progress Notes (Signed)
 04/13/2024   Endocrinology follow-up note  Subjective:    Patient ID: Taylor Wilkins, female    DOB: 1945-07-13, PCP Mikle Beau, FNP   Past Medical History:  Diagnosis Date   Cancer [facility])    Basal cell on face   Chest pain 08/2008   normal coronary angiogram   Diabetes (HCC)    Hyperlipemia    Hypertension    Leukemia, chronic (HCC)    CLL   Sleep apnea    Past Surgical History:  Procedure Laterality Date   ABDOMINAL HYSTERECTOMY     ANKLE SURGERY  05/13/2004   CATARACT EXTRACTION Bilateral 2023   TONSILLECTOMY  05/14/1955   Social History   Socioeconomic History   Marital status: Married    Spouse name: Not on file   Number of children: Not on file   Years of education: Not on file   Highest education level: Not on file  Occupational History   Not on file  Tobacco Use   Smoking status: Never   Smokeless tobacco: Never  Vaping Use   Vaping status: Never Used  Substance and Sexual Activity   Alcohol use: Never   Drug use: Never   Sexual activity: Not on file  Other Topics Concern   Not on file  Social History Narrative   Not on file   Social Drivers of Health   Financial Resource Strain: Not on file  Food Insecurity: Not on file  Transportation Needs: Not on file  Physical Activity: Not on file  Stress: Not on file  Social Connections: Not on file   Outpatient Encounter Medications as of 04/13/2024  Medication Sig   tirzepatide  (MOUNJARO ) 5 MG/0.5ML Pen Inject 5 mg into the skin once a week.   aspirin 81 MG tablet Take 81 mg by mouth daily.   atorvastatin  (LIPITOR) 10 MG tablet Take 1 tablet (10 mg total) by mouth daily.   B Complex Vitamins (VITAMIN B COMPLEX PO) Take 1 tablet by mouth daily.   B-D UF III MINI PEN NEEDLES 31G X 5 MM MISC USE 1 NEEDLE AS DIRECTED   Calcium  Citrate-Vitamin D  (CALCIUM  + D PO) Take by mouth.   cholecalciferol (VITAMIN D ) 1000 UNITS tablet Take 1,000 Units by mouth daily.   Continuous Blood Gluc Receiver  (FREESTYLE LIBRE 2 READER) DEVI USE AS DIRECTED   Continuous Blood Gluc Sensor (FREESTYLE LIBRE 2 SENSOR) MISC Use to check glucose as directed. Change sensor every 14 days.   Continuous Glucose Receiver (FREESTYLE LIBRE 3 READER) DEVI 1 Piece by Does not apply route once as needed for up to 1 dose.   Continuous Glucose Sensor (FREESTYLE LIBRE 3 PLUS SENSOR) MISC Change sensor every 15 days.   diclofenac Sodium (VOLTAREN) 1 % GEL Apply 1 application topically 4 (four) times daily.   escitalopram (LEXAPRO) 5 MG tablet Take 5 mg by mouth daily.   Fish Oil-Cholecalciferol (FISH OIL + D3) 1000-1000 MG-UNIT CAPS Take by mouth.   furosemide (LASIX) 20 MG tablet Take 40 mg by mouth daily as needed.   hydrALAZINE (APRESOLINE) 50 MG tablet Take 50 mg by mouth 3 (three) times daily.   Ibrutinib (IMBRUVICA) 280 MG TABS Take 1 tablet by mouth daily in the afternoon.   insulin  glargine, 1 Unit Dial , (TOUJEO  SOLOSTAR) 300 UNIT/ML Solostar Pen Inject 50 Units into the skin at bedtime.   insulin  lispro (HUMALOG  KWIKPEN) 200 UNIT/ML KwikPen Inject 10-16 Units into the skin 3 (three) times daily with meals.   Insulin  Pen  Needle (BD PEN NEEDLE NANO U/F) 32G X 4 MM MISC 1 each by Does not apply route 4 (four) times daily.   lisinopril (ZESTRIL) 20 MG tablet Take 40 mg by mouth daily with breakfast.   Magnesium 250 MG TABS every day   pregabalin (LYRICA) 50 MG capsule Lyrica 50 mg capsule  Take 1 capsule 3 times a day by oral route.   Probiotic Product (PROBIOTIC PO) Take 1 tablet by mouth daily in the afternoon.   TURMERIC PO Take 1 tablet by mouth daily.   [DISCONTINUED] tirzepatide  (MOUNJARO ) 2.5 MG/0.5ML Pen INJECT 2.5 MG UNDER THE SKIN ONCE WEEKLY   No facility-administered encounter medications on file as of 04/13/2024.   ALLERGIES: No Known Allergies VACCINATION STATUS: Immunization History  Administered Date(s) Administered   Moderna Sars-Covid-2 Vaccination 06/09/2019, 07/07/2019    Diabetes She  presents for her follow-up diabetic visit. She has type 2 diabetes mellitus. Onset time: He was diagnosed at approximate age of 5 years. Her disease course has been improving. There are no hypoglycemic associated symptoms. Pertinent negatives for hypoglycemia include no confusion, headaches, pallor or seizures. Pertinent negatives for diabetes include no chest pain, no polydipsia, no polyphagia and no polyuria. There are no hypoglycemic complications. Symptoms are improving. Diabetic complications include retinopathy. Risk factors for coronary artery disease include diabetes mellitus, dyslipidemia, hypertension and tobacco exposure. Current diabetic treatment includes insulin  injections and oral agent (dual therapy). She is compliant with treatment most of the time. Her weight is decreasing steadily. She has had a previous visit with a dietitian. She rarely participates in exercise. Her home blood glucose trend is decreasing steadily. Her breakfast blood glucose range is generally 180-200 mg/dl. Her lunch blood glucose range is generally 180-200 mg/dl. Her dinner blood glucose range is generally 180-200 mg/dl. Her bedtime blood glucose range is generally 180-200 mg/dl. Her overall blood glucose range is 180-200 mg/dl. (She presents with significant improvement in her glycemic profile averaging 200 in point-of-care A1c of 7.8% from 9.5% during her last visit.  Her most recent AGP shows 48% time in range, 22% level 1 hyperglycemia, 30% Libre to hyperglycemia.  She's tolerated Mounjaro , denies any hypoglycemia.    ) An ACE inhibitor/angiotensin II receptor blocker is being taken. Eye exam is current.  Hyperlipidemia This is a chronic problem. The current episode started more than 1 year ago. The problem is controlled. Exacerbating diseases include diabetes and obesity. Pertinent negatives include no chest pain, myalgias or shortness of breath. Current antihyperlipidemic treatment includes statins. Risk factors  for coronary artery disease include diabetes mellitus, obesity, a sedentary lifestyle and post-menopausal.  Hypertension This is a chronic problem. The current episode started more than 1 year ago. The problem is controlled. Pertinent negatives include no chest pain, headaches, palpitations or shortness of breath. Risk factors for coronary artery disease include diabetes mellitus, dyslipidemia, sedentary lifestyle, post-menopausal state and obesity. Past treatments include calcium  channel blockers. Hypertensive end-organ damage includes retinopathy.     Review of systems    Objective:    BP (!) 144/66   Pulse 84   Ht 5' 2 (1.575 m)   Wt 182 lb 6.4 oz (82.7 kg)   BMI 33.36 kg/m   Wt Readings from Last 3 Encounters:  04/13/24 182 lb 6.4 oz (82.7 kg)  12/09/23 189 lb 9.6 oz (86 kg)  11/24/23 191 lb 12.8 oz (87 kg)        Results for orders placed or performed in visit on 04/13/24  HgB A1c  Collection Time: 04/13/24 11:48 AM  Result Value Ref Range   Hemoglobin A1C     HbA1c POC (<> result, manual entry)     HbA1c, POC (prediabetic range)     HbA1c, POC (controlled diabetic range) 7.8 (A) 0.0 - 7.0 %   Lipid Panel     Component Value Date/Time   CHOL 161 04/05/2024 0000   TRIG 158 (H) 04/05/2024 0000   HDL 44 04/05/2024 0000   CHOLHDL 3.7 04/05/2024 0000   LDLCALC 90 04/05/2024 0000  30 units twice a day Recent Results (from the past 2160 hours)  Comprehensive metabolic panel with GFR     Status: Abnormal   Collection Time: 04/05/24 12:00 AM  Result Value Ref Range   Glucose 160 (H) 70 - 99 mg/dL   BUN 25 8 - 27 mg/dL   Creatinine, Ser 9.15 0.57 - 1.00 mg/dL   eGFR 72 >40 fO/fpw/8.26   BUN/Creatinine Ratio 30 (H) 12 - 28   Sodium 139 134 - 144 mmol/L   Potassium 4.6 3.5 - 5.2 mmol/L   Chloride 103 96 - 106 mmol/L   CO2 27 20 - 29 mmol/L   Calcium  9.4 8.7 - 10.3 mg/dL   Total Protein 5.6 (L) 6.0 - 8.5 g/dL   Albumin 4.1 3.8 - 4.8 g/dL   Globulin, Total 1.5  1.5 - 4.5 g/dL   Bilirubin Total 0.3 0.0 - 1.2 mg/dL   Alkaline Phosphatase 69 49 - 135 IU/L   AST 16 0 - 40 IU/L   ALT 12 0 - 32 IU/L  Lipid panel     Status: Abnormal   Collection Time: 04/05/24 12:00 AM  Result Value Ref Range   Cholesterol, Total 161 100 - 199 mg/dL   Triglycerides 841 (H) 0 - 149 mg/dL   HDL 44 >60 mg/dL   VLDL Cholesterol Cal 27 5 - 40 mg/dL   LDL Chol Calc (NIH) 90 0 - 99 mg/dL   Chol/HDL Ratio 3.7 0.0 - 4.4 ratio    Comment:                                   T. Chol/HDL Ratio                                             Men  Women                               1/2 Avg.Risk  3.4    3.3                                   Avg.Risk  5.0    4.4                                2X Avg.Risk  9.6    7.1                                3X Avg.Risk 23.4   11.0   FIB-4 W/REFLEX TO ELF     Status: None  Collection Time: 04/05/24 12:00 AM  Result Value Ref Range   Platelets 244 150 - 450 x10E3/uL   FIB-4 Index 1.46 0.00 - 2.67    Comment:       0.00 - 1.29 Low risk for advanced liver fibrosis       1.30 - 2.67 Indeterminate risk for advanced liver                   fibrosis             >2.67 High risk for advanced fibrosis and                   for the development of other liver                   related events   Enhanced Liver Fibrosis (ELF)     Status: None   Collection Time: 04/05/24 12:00 AM  Result Value Ref Range   ELF(TM) Score 9.70 <9.80    Comment: ELF(TM) Score Interpretation: Risk cut-offs to assess the likelihood of progression to cirrhosis and liver-related clinical events within 3.9 years following baseline ELF score (IQR: 14.0-22.4 months)*:                           Lower risk             < 9.80                           Mid risk         9.80 - 11.29                           Higher risk            >11.29 Note: The ELF(TM) Score is a unitless numerical value. Ponce SA, Wong VW, [place] T, et al. Selonsertib for patients with bridging fibrosis  or compensated cirrhosis due to NASH: Results from randomized phase III STELLAR trials. J Hepatol. 2020 Jul;73(1):26-39.   HgB A1c     Status: Abnormal   Collection Time: 04/13/24 11:48 AM  Result Value Ref Range   Hemoglobin A1C     HbA1c POC (<> result, manual entry)     HbA1c, POC (prediabetic range)     HbA1c, POC (controlled diabetic range) 7.8 (A) 0.0 - 7.0 %     Assessment & Plan:   1. Uncontrolled type 2 diabetes mellitus with complication.  She presents with significant improvement in her glycemic profile averaging 209 in point-of-care A1c of 7.8% from 9.5% during her last visit.  Her most recent AGP shows 48% time in range, 22% level 1 hyperglycemia, 30% Libre to hyperglycemia.  She's tolerated Mounjaro , denies any hypoglycemia.    Patient remains at a high risk for more acute and chronic complications of diabetes which include CAD, CVA, CKD, retinopathy, and neuropathy. These are all discussed in detail with the patient. Glucose logs and insulin  administration records pertaining to this visit,  to be scanned into patient's records.  Recent labs reviewed.   - I have re-counseled the patient on diet management  by adopting a carbohydrate restricted / protein rich  Diet.   - she acknowledges that there is a room for improvement in her food and drink choices. - Suggestion is made for her to avoid simple carbohydrates  from her diet including Cakes, Sweet  Desserts, Ice Cream, Soda (diet and regular), Sweet Tea, Candies, Chips, Cookies, Store Bought Juices, Alcohol in Excess of  1-2 drinks a day, Artificial Sweeteners,  Coffee Creamer, and Sugar-free Products, Lemonade. This will help patient to have more stable blood glucose profile and potentially avoid unintended weight gain. - Patient is advised to stick to a routine mealtimes to eat 3 meals  a day and avoid unnecessary snacks ( to snack only to correct hypoglycemia).  - I have approached patient with the following  individualized plan to manage diabetes and patient agrees.  - She we will continue to need intensive treatment with basal/bolus insulin  in order for her to maintain control of diabetes to target.    -Avoiding hypoglycemia as a priority in this patient.  -She is willing and advised to continue Toujeo  50 units nightly, advised to continue Humalog   10-16  units 3 times a day before meals for pre-meal glucose above 90 mg/dL,  associated with strict documenting blood glucose at least 4 times a day-before meals and at bedtime.   -I discussed and upgraded her CGM to freestyle libre 3+.  -Patient is encouraged to call clinic for blood glucose levels less than 70 or above 200 mg /dl x 3 readings in a row.  -  She will stay off of metformin for now.  She did not tolerate Jardiance .  She has tolerated low-dose Mounjaro , discussed and increase Mounjaro  to 5 mg subcutaneously weekly.  Side effects and precautions discussed with her.  This medication will be slowly advanced to higher standard doses as she tolerates.   Her anti-GAD and anti-islet cell antibodies are negative.  2) BP/HTN:  Her blood pressure is not  controlled to target.  She is advised to increase her lisinopril to 20 mg p.o. daily at breakfast.  She also takes verapamil 240 mg p.o. daily.   3) Lipids/HPL: Her recent lipid panel showed LDL increasing to 91 after overall improvement from 139.   She is advised to continue atorvastatin  10 mg p.o. daily.  She will have fasting lipid panel before her next visit.     Side effects and precautions discussed with her.     4)  Weight/Diet:  Her BMI is 33.36 kg/m, achieving some weight loss since last visit. She is a candidate for modest weight loss.  CDE consult in progress, exercise, and carbohydrates information provided. -Her thyroid function test is within normal limits.     5) Chronic Care/Health Maintenance:  -Patient is on ACEI/ARB and Statin medications and encouraged to continue to  follow up with Ophthalmology, Podiatrist at least yearly or according to recommendations, and advised to  stay away from smoking. I have recommended yearly flu vaccine and pneumonia vaccination at least every 5 years; moderate intensity exercise for up to 150 minutes weekly; and  sleep for at least 7 hours a day.  Her diabetes foot exam was significant for mild neuropathy-December 09, 2023. Her screening ABI was normal in December 2021, her next study will be due in December 2026, or sooner if needed.   - I advised patient to maintain close follow up with her PCP for primary care needs.   I spent  26  minutes in the care of the patient today including review of labs from CMP, Lipids, Thyroid Function, Hematology (current and previous including abstractions from other facilities); face-to-face time discussing  her blood glucose readings/logs, discussing hypoglycemia and hyperglycemia episodes and symptoms, medications doses, her options of short and long term treatment  based on the latest standards of care / guidelines;  discussion about incorporating lifestyle medicine;  and documenting the encounter. Risk reduction counseling performed per USPSTF guidelines to reduce  obesity and cardiovascular risk factors.     Please refer to Patient Instructions for Blood Glucose Monitoring and Insulin /Medications Dosing Guide  in media tab for additional information. Please  also refer to  Patient Self Inventory in the Media  tab for reviewed elements of pertinent patient history.  Taylor Wilkins participated in the discussions, expressed understanding, and voiced agreement with the above plans.  All questions were answered to her satisfaction. she is encouraged to contact clinic should she have any questions or concerns prior to her return visit.     Follow up plan: -Return in about 6 months (around 10/12/2024) for Bring Meter/CGM Device/Logs- A1c in Office.  Ranny Earl, MD Phone: [phone]  Fax:  [phone]  This note was partially dictated with voice recognition software. Similar sounding words can be transcribed inadequately or may not  be corrected upon review.  04/13/2024, 11:53 AM

## 2024-04-26 ENCOUNTER — Encounter (INDEPENDENT_AMBULATORY_CARE_PROVIDER_SITE_OTHER): Admitting: Ophthalmology

## 2024-04-26 DIAGNOSIS — H35033 Hypertensive retinopathy, bilateral: Secondary | ICD-10-CM

## 2024-04-26 DIAGNOSIS — E113393 Type 2 diabetes mellitus with moderate nonproliferative diabetic retinopathy without macular edema, bilateral: Secondary | ICD-10-CM | POA: Diagnosis not present

## 2024-04-26 DIAGNOSIS — H35371 Puckering of macula, right eye: Secondary | ICD-10-CM

## 2024-04-26 DIAGNOSIS — Z794 Long term (current) use of insulin: Secondary | ICD-10-CM | POA: Diagnosis not present

## 2024-04-26 DIAGNOSIS — H353131 Nonexudative age-related macular degeneration, bilateral, early dry stage: Secondary | ICD-10-CM

## 2024-04-26 DIAGNOSIS — I10 Essential (primary) hypertension: Secondary | ICD-10-CM

## 2024-04-26 DIAGNOSIS — H43813 Vitreous degeneration, bilateral: Secondary | ICD-10-CM

## 2024-05-01 ENCOUNTER — Other Ambulatory Visit: Payer: Self-pay | Admitting: "Endocrinology

## 2024-05-01 DIAGNOSIS — E1165 Type 2 diabetes mellitus with hyperglycemia: Secondary | ICD-10-CM

## 2024-10-13 ENCOUNTER — Ambulatory Visit: Admitting: "Endocrinology

## 2024-10-27 ENCOUNTER — Encounter (INDEPENDENT_AMBULATORY_CARE_PROVIDER_SITE_OTHER): Admitting: Ophthalmology
